# Patient Record
Sex: Female | Born: 1980 | Race: Black or African American | Hispanic: No | Marital: Single | Smoking: Never smoker
Health system: Southern US, Community
[De-identification: ages and names within clinical notes are randomized; demographics above are authoritative.]

## PROBLEM LIST (undated history)

## (undated) DIAGNOSIS — D369 Benign neoplasm, unspecified site: Secondary | ICD-10-CM

## (undated) DIAGNOSIS — B977 Papillomavirus as the cause of diseases classified elsewhere: Secondary | ICD-10-CM

## (undated) DIAGNOSIS — T7840XA Allergy, unspecified, initial encounter: Secondary | ICD-10-CM

## (undated) DIAGNOSIS — B019 Varicella without complication: Secondary | ICD-10-CM

## (undated) DIAGNOSIS — J45909 Unspecified asthma, uncomplicated: Secondary | ICD-10-CM

## (undated) HISTORY — DX: Papillomavirus as the cause of diseases classified elsewhere: B97.7

## (undated) HISTORY — DX: Allergy, unspecified, initial encounter: T78.40XA

## (undated) HISTORY — DX: Unspecified asthma, uncomplicated: J45.909

## (undated) HISTORY — DX: Benign neoplasm, unspecified site: D36.9

## (undated) HISTORY — DX: Varicella without complication: B01.9

## (undated) HISTORY — PX: DERMOID CYST  EXCISION: SHX1452

## (undated) HISTORY — PX: APPENDECTOMY: SHX54

## (undated) HISTORY — PX: OTHER SURGICAL HISTORY: SHX169

---

## 2004-06-06 ENCOUNTER — Emergency Department (HOSPITAL_COMMUNITY): Admission: EM | Admit: 2004-06-06 | Discharge: 2004-06-06 | Payer: Self-pay | Admitting: Emergency Medicine

## 2004-07-10 ENCOUNTER — Emergency Department (HOSPITAL_COMMUNITY): Admission: EM | Admit: 2004-07-10 | Discharge: 2004-07-10 | Payer: Self-pay | Admitting: Family Medicine

## 2005-07-28 ENCOUNTER — Ambulatory Visit: Payer: Self-pay | Admitting: Obstetrics and Gynecology

## 2005-08-11 ENCOUNTER — Ambulatory Visit: Payer: Self-pay | Admitting: Obstetrics and Gynecology

## 2005-10-12 HISTORY — PX: WISDOM TOOTH EXTRACTION: SHX21

## 2005-10-15 ENCOUNTER — Emergency Department (HOSPITAL_COMMUNITY): Admission: EM | Admit: 2005-10-15 | Discharge: 2005-10-15 | Payer: Self-pay | Admitting: Emergency Medicine

## 2005-11-24 ENCOUNTER — Encounter (INDEPENDENT_AMBULATORY_CARE_PROVIDER_SITE_OTHER): Payer: Self-pay | Admitting: *Deleted

## 2005-11-24 ENCOUNTER — Ambulatory Visit: Payer: Self-pay | Admitting: Obstetrics and Gynecology

## 2006-02-20 ENCOUNTER — Emergency Department (HOSPITAL_COMMUNITY): Admission: EM | Admit: 2006-02-20 | Discharge: 2006-02-20 | Payer: Self-pay | Admitting: Family Medicine

## 2006-06-03 ENCOUNTER — Emergency Department (HOSPITAL_COMMUNITY): Admission: EM | Admit: 2006-06-03 | Discharge: 2006-06-03 | Payer: Self-pay | Admitting: Family Medicine

## 2006-06-24 ENCOUNTER — Ambulatory Visit: Payer: Self-pay | Admitting: *Deleted

## 2006-06-24 ENCOUNTER — Encounter (INDEPENDENT_AMBULATORY_CARE_PROVIDER_SITE_OTHER): Payer: Self-pay | Admitting: *Deleted

## 2006-09-23 ENCOUNTER — Ambulatory Visit: Payer: Self-pay | Admitting: *Deleted

## 2006-09-23 ENCOUNTER — Other Ambulatory Visit: Admission: RE | Admit: 2006-09-23 | Discharge: 2006-09-23 | Payer: Self-pay | Admitting: Obstetrics and Gynecology

## 2006-10-22 ENCOUNTER — Encounter (INDEPENDENT_AMBULATORY_CARE_PROVIDER_SITE_OTHER): Payer: Self-pay | Admitting: *Deleted

## 2006-10-22 ENCOUNTER — Ambulatory Visit: Payer: Self-pay | Admitting: Family Medicine

## 2006-10-22 ENCOUNTER — Other Ambulatory Visit: Admission: RE | Admit: 2006-10-22 | Discharge: 2006-10-22 | Payer: Self-pay | Admitting: Obstetrics and Gynecology

## 2006-11-05 ENCOUNTER — Ambulatory Visit: Payer: Self-pay | Admitting: Obstetrics & Gynecology

## 2007-04-14 ENCOUNTER — Emergency Department (HOSPITAL_COMMUNITY): Admission: EM | Admit: 2007-04-14 | Discharge: 2007-04-14 | Payer: Self-pay | Admitting: Family Medicine

## 2007-04-29 ENCOUNTER — Encounter: Payer: Self-pay | Admitting: Obstetrics & Gynecology

## 2007-04-29 ENCOUNTER — Ambulatory Visit: Payer: Self-pay | Admitting: Obstetrics & Gynecology

## 2007-10-21 ENCOUNTER — Ambulatory Visit: Payer: Self-pay | Admitting: Obstetrics & Gynecology

## 2007-10-21 ENCOUNTER — Encounter: Payer: Self-pay | Admitting: Obstetrics & Gynecology

## 2008-01-26 ENCOUNTER — Emergency Department (HOSPITAL_COMMUNITY): Admission: EM | Admit: 2008-01-26 | Discharge: 2008-01-26 | Payer: Self-pay | Admitting: Emergency Medicine

## 2008-03-21 ENCOUNTER — Ambulatory Visit: Payer: Self-pay | Admitting: Obstetrics and Gynecology

## 2008-03-21 ENCOUNTER — Encounter: Payer: Self-pay | Admitting: Obstetrics and Gynecology

## 2009-04-10 ENCOUNTER — Encounter: Payer: Self-pay | Admitting: Advanced Practice Midwife

## 2009-04-10 ENCOUNTER — Ambulatory Visit: Payer: Self-pay | Admitting: Obstetrics & Gynecology

## 2009-06-22 ENCOUNTER — Emergency Department (HOSPITAL_COMMUNITY): Admission: EM | Admit: 2009-06-22 | Discharge: 2009-06-22 | Payer: Self-pay | Admitting: Emergency Medicine

## 2010-04-11 ENCOUNTER — Encounter: Payer: Self-pay | Admitting: Physician Assistant

## 2010-04-11 ENCOUNTER — Ambulatory Visit: Payer: Self-pay | Admitting: Obstetrics & Gynecology

## 2010-04-11 LAB — CONVERTED CEMR LAB: Pap Smear: NEGATIVE

## 2011-01-16 LAB — POCT URINALYSIS DIP (DEVICE)
Bilirubin Urine: NEGATIVE
Glucose, UA: NEGATIVE mg/dL
Hgb urine dipstick: NEGATIVE
Ketones, ur: NEGATIVE mg/dL
Nitrite: NEGATIVE
Protein, ur: NEGATIVE mg/dL
Specific Gravity, Urine: 1.015 (ref 1.005–1.030)
Urobilinogen, UA: 0.2 mg/dL (ref 0.0–1.0)
pH: 7.5 (ref 5.0–8.0)

## 2011-01-16 LAB — WET PREP, GENITAL
Trich, Wet Prep: NONE SEEN
WBC, Wet Prep HPF POC: NONE SEEN
Yeast Wet Prep HPF POC: NONE SEEN

## 2011-01-16 LAB — GC/CHLAMYDIA PROBE AMP, GENITAL
Chlamydia, DNA Probe: NEGATIVE
GC Probe Amp, Genital: NEGATIVE

## 2011-01-16 LAB — POCT PREGNANCY, URINE: Preg Test, Ur: NEGATIVE

## 2011-02-24 NOTE — Group Therapy Note (Signed)
NAME:  Kristen Floyd, Kristen Floyd NO.:  [id number]   MEDICAL RECORD NO.:  [id number]          PATIENT TYPE:  WOC   LOCATION:  [facility]                   FACILITY:  [facility]   PHYSICIAN:  Elsie Lincoln, MD      DATE OF BIRTH:  1980-10-18   DATE OF SERVICE:                                  CLINIC NOTE   HISTORY:  This is a 30 year old gravida 0, para 0, who presents today  for annual exam and Pap smear.  She has no complaints today.  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  She has had 2 Pap smears  in January 2009 and June 2009 that were both normal and so she has now  moved to the annual Pap smear schedule.  She denies any fever, abdominal  pain or other constitutional symptoms.   SOCIAL HISTORY:  The patient does not smoke.  She works as an Freight forwarder at [employer].   MEDICAL HISTORY:  Remarkable only for abnormal Paps.   SURGICAL HISTORY:  1. Remarkable for a right dermoid cyst removed in 1997 along with her      whole ovary on that side.  2. She also has a history of cryosurgery in 2006.   FAMILY HISTORY:  Noncontributory.   MEDICATIONS:  Tri-Sprintec 1 p.o. daily for contraception.   PHYSICAL EXAMINATION:  VITAL SIGNS:  Stable.  Blood pressure 120/84,  temperature 99, pulse 87, respiratory 16, weight 167.2 which is up from  December 2007 when it was 156.6, height is 5 feet 6-1/2 inches.  HEENT:  Within normal limits.  Thyroid normal, not enlarged, no nodules  appreciated.  CHEST:  Clear to auscultation.  HEART:  Regular rate and rhythm.  BREASTS:  Soft, nontender with no masses.  The patient perform her own  self breast exams at home.  ABDOMEN:  Soft, nontender.  Spleen and liver both normal.  No masses or  pain noted.  PELVIC:  Shows normal rugae in the vagina, small amount of white creamy  discharge is noted.  Cervix is nulliparous.  Pap smear was obtained as  was  gonorrhea and chlamydia per patient's request.  Bimanual exam shows  uterus that is small with no masses and nontender.  Right adnexa and  right ovary are surgically absent.  Left adnexa is normal.  There is  good vaginal tone.  No evidence of cystocele or rectocele.  EXTREMITIES:  Within normal limits.   ASSESSMENT:  29. A 31 year old nulliparous who presents for annual exam.  2. History of abnormal Pap.  3. History of dermoid cyst on the right.   PLAN:  1. Pap was obtained with gonorrhea and chlamydia cultures added.  2. Prescription for Tri-Sprintec 1 p.o. daily, number 3 months with      refills for 1 year.  3. Further followup for Paps will include annual Paps unless this one      is abnormal.     ______________________________  Wynelle Bourgeois, CNM    ______________________________  Elsie Lincoln, MD  MW/MEDQ  D:  04/10/2009  T:  04/11/2009  Job:  [id number]

## 2011-02-27 NOTE — Group Therapy Note (Signed)
NAME:  Kristen Floyd, GIESELMAN NO.:  [id number]   MEDICAL RECORD NO.:  [id number]          PATIENT TYPE:  WOC   [facility]ATION[facility]                   [facility]ACILITY[facility]  [facility]   PHYSICIAN:  Ellis Parents, MD    DATE OF BIRTH:  [birth year]   DATE OF SERVICE:  07/28/2005                                    CLINIC NOTE   This 30 year old multiparous female is referred by [facility] for  evaluation for a LEEP procedure.  Her history is that she had LSIL by  cytology on December 2004 and December 2005 and then on May 6, she had HSIL,  and June 2006, she had a colposcopy with biopsy showing CIN-1.  The patient  denies any history of D&C, therapeutic abortion, or any operative procedures  of the cervix.   PHYSICAL EXAMINATION:  The vagina is clean.  The cervix is very hypertrophic  with a very wide __________, probably in the range of 2-2.5 cm, and a large  external os.  It is deemed appropriate in this situation to return the  patient for cryosurgery.  The patient was seen in consultation with Dr.  Okey Dupre.  Everything was adequately explained to the patient.  Breast exam  revealed a well defined, 1 cm, firm, mobile, nontender mass in the left  breast, left upper outer quadrant.  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           ______________________________  Ellis Parents, MD     SA/MEDQ  D:  07/28/2005  T:  07/28/2005  Job:  [id number]

## 2011-02-27 NOTE — Group Therapy Note (Signed)
NAME:  Kristen Floyd, QUIZON NO.:  [id number]   MEDICAL RECORD NO.:  [id number]          PATIENT TYPE:  WOC   [facility]                   [facility]ILITY[facility]  [facility]   PHYSICIAN:  Carolanne Grumbling, M.D.   DATE OF BIRTH:  Nov 16, 1980   DATE OF SERVICE:  06/24/2006                                    CLINIC NOTE   A 30 year old, G0 here for repeat Pap smear. The patient had low cell by  cytology in December 2004 and December 2005. On Feb 14, 2005, she had  __________  and in June 2006, she had colposcopy with biopsy showing CIN 1.  She then had cryotherapy August 11, 2005. 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 The patient is here for her  followup Pap smear. She denies any problems.   The patient also requests birth control pills. [facility] and would like [facility]. She denies any history of  hypertension or blood clots. She also does not smoke.   PHYSICAL EXAMINATION:  VITAL SIGNS:  Per nursing.  GENERAL:  A well-developed, well-nourished female in no apparent distress.  GU:  External genitalia within normal limits. __________  cervix and without  lesions. Uterus anteverted, within normal limits. Adnexa is free of masses.   ASSESSMENT/PLAN:  1. 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 Repeat Pap smear done today, the patient will be      notified via mail with the next __________ .  2. Contraceptive counseling. Prescription for Lo/Ovral was given today.           ______________________________  Carolanne Grumbling, M.D.     TW/MEDQ  D:  06/24/2006  T:  06/25/2006  Job:  [id number]

## 2011-05-04 ENCOUNTER — Ambulatory Visit (INDEPENDENT_AMBULATORY_CARE_PROVIDER_SITE_OTHER): Payer: Commercial Managed Care - PPO | Admitting: Obstetrics and Gynecology

## 2011-05-04 ENCOUNTER — Other Ambulatory Visit (HOSPITAL_COMMUNITY)
Admission: RE | Admit: 2011-05-04 | Discharge: 2011-05-04 | Disposition: A | Discharge status: Home / Self Care | Payer: 59 | Source: Ambulatory Visit | Attending: Obstetrics and Gynecology | Admitting: Obstetrics and Gynecology

## 2011-05-04 ENCOUNTER — Encounter: Payer: Self-pay | Admitting: *Deleted

## 2011-05-04 VITALS — BP 135/92 | HR 74 | Temp 99.4°F | Ht 67.0 in | Wt 162.7 lb

## 2011-05-04 DIAGNOSIS — Z01419 Encounter for gynecological examination (general) (routine) without abnormal findings: Secondary | ICD-10-CM | POA: Insufficient documentation

## 2011-05-04 DIAGNOSIS — Z0000 Encounter for general adult medical examination without abnormal findings: Secondary | ICD-10-CM

## 2011-05-04 NOTE — Progress Notes (Signed)
Patient is a 30 year old nulligravida African American female. She is in for her annual GYN exam and has had a history of L. SIL which was treated with cryosurgery. Scratch that her last Pap smear one year ago was normal. Breast examination breasts are symmetrical no dominant masses no nipple discharge no supraclavicular or axillary nodes do an examination was soft flat nontender no masses or organomegaly external genitalia is normal uterus within normal limits vagina is clean well rugated cervix clean nulliparous Pap smear was taken uterus anterior normal size shape consistency adnexa could not be outlined to the [place] the patient. The patient has only one ovary had an oophorectomy at age of 69 her dermoid cyst torsion.

## 2011-07-01 ENCOUNTER — Inpatient Hospital Stay (INDEPENDENT_AMBULATORY_CARE_PROVIDER_SITE_OTHER)
Admission: RE | Admit: 2011-07-01 | Discharge: 2011-07-01 | Disposition: A | Discharge status: Home / Self Care | Payer: 59 | Source: Ambulatory Visit | Attending: Emergency Medicine | Admitting: Emergency Medicine

## 2011-07-01 ENCOUNTER — Ambulatory Visit (INDEPENDENT_AMBULATORY_CARE_PROVIDER_SITE_OTHER): Payer: 59

## 2011-07-01 DIAGNOSIS — J45909 Unspecified asthma, uncomplicated: Secondary | ICD-10-CM

## 2011-07-01 DIAGNOSIS — R197 Diarrhea, unspecified: Secondary | ICD-10-CM

## 2011-07-01 LAB — POCT I-STAT, CHEM 8
BUN: 4 mg/dL — ABNORMAL LOW (ref 6–23)
Calcium, Ion: 1.15 mmol/L (ref 1.12–1.32)
Chloride: 104 mEq/L (ref 96–112)
Creatinine, Ser: 0.9 mg/dL (ref 0.50–1.10)
Glucose, Bld: 95 mg/dL (ref 70–99)
HCT: 41 % (ref 36.0–46.0)
Hemoglobin: 13.9 g/dL (ref 12.0–15.0)
Potassium: 3.4 mEq/L — ABNORMAL LOW (ref 3.5–5.1)
Sodium: 142 mEq/L (ref 135–145)
TCO2: 24 mmol/L (ref 0–100)

## 2011-07-28 LAB — POCT RAPID STREP A: Streptococcus, Group A Screen (Direct): NEGATIVE

## 2012-04-15 ENCOUNTER — Other Ambulatory Visit: Payer: Self-pay | Admitting: Obstetrics & Gynecology

## 2012-05-04 ENCOUNTER — Ambulatory Visit: Payer: 59 | Admitting: Physician Assistant

## 2012-05-11 ENCOUNTER — Encounter: Payer: Self-pay | Admitting: Family

## 2012-05-11 ENCOUNTER — Ambulatory Visit (INDEPENDENT_AMBULATORY_CARE_PROVIDER_SITE_OTHER): Payer: 59 | Admitting: Family

## 2012-05-11 VITALS — BP 128/91 | HR 80 | Temp 97.5°F | Resp 20 | Ht 66.5 in | Wt 147.7 lb

## 2012-05-11 DIAGNOSIS — Z01419 Encounter for gynecological examination (general) (routine) without abnormal findings: Secondary | ICD-10-CM

## 2012-05-11 NOTE — Progress Notes (Signed)
  Subjective:     Kristen Floyd is a 31 y.o. female here for a routine exam.  Current complaints: None today; desires pregnancy in approximately one year.  Personal health questionnaire reviewed: yes.  Desires screen for sickle cell, mother has sickle cell disease.     Gynecologic History Patient's last menstrual period was 05/04/2012. Contraception: OCP (estrogen/progesterone) Last Pap: 05/03/12. Results were: normal Last mammogram: n/a.   Obstetric History OB History    Grav Para Term Preterm Abortions TAB SAB Ect Mult Living   0 0               The following portions of the patient's history were reviewed and updated as appropriate: allergies, current medications, past family history, past medical history, past social history, past surgical history and problem list.  Review of Systems Pertinent items are noted in HPI.    Objective:    BP 128/91  Pulse 80  Temp 97.5 F (36.4 C) (Oral)  Resp 20  Ht 5' 6.5" (1.689 m)  Wt 147 lb 11.2 oz (66.996 kg)  BMI 23.48 kg/m2  LMP 05/04/2012 GENERAL: Well-developed, well-nourished female in no acute distress.  HEENT: Normocephalic, atraumatic. Sclerae anicteric.  NECK: Supple. Normal thyroid.  LUNGS: Clear to auscultation bilaterally.  HEART: Regular rate and rhythm. BREASTS: Symmetric in size. No masses, skin changes, nipple drainage, or lymphadenopathy. ABDOMEN: Soft, nontender, nondistended. No organomegaly. PELVIC: Normal external female genitalia. Vagina is pink and rugated.  Normal discharge. Normal cervix contour. Pap smear obtained. Uterus is normal in size. No adnexal mass or tenderness.  EXTREMITIES: No cyanosis, clubbing, or edema, 2+ distal pulses.   Assessment:    Healthy female exam.    Plan:   Follow up pap smear Routine preventative health maintenance measures emphasized

## 2012-05-12 LAB — SICKLE CELL SCREEN: Sickle Cell Screen: NEGATIVE

## 2012-05-20 ENCOUNTER — Telehealth: Payer: Self-pay | Admitting: *Deleted

## 2012-05-20 NOTE — Telephone Encounter (Signed)
Pt called requesting her pap smear and sickle cell results. Asked that we return her call on her cell phone [phone]

## 2012-05-23 NOTE — Telephone Encounter (Signed)
Called pt and discussed her test results for sickle cell and Pap.  Pt had additional questions regarding that her sickle cell test was negative, her mother has the disease and father is negative.  She is wondering why she would not test positive for the trait. I told pt that I did not know why and recommended that she discuss it with her PCP.  Pt states that she does not currently have a PCP but is working on that. We also discussed the results of her Pap and that she will need a colpo because of the abnormal results. Pt has had abnormal Pap and colpo in the past- approx. 6 yrs ago.  She stated understanding of the plan of care. I told her she will be contacted with an appt.  Pt voiced understanding.

## 2012-06-23 ENCOUNTER — Encounter: Payer: 59 | Admitting: Obstetrics & Gynecology

## 2012-06-24 ENCOUNTER — Other Ambulatory Visit (HOSPITAL_COMMUNITY)
Admission: RE | Admit: 2012-06-24 | Discharge: 2012-06-24 | Disposition: A | Discharge status: Home / Self Care | Payer: 59 | Source: Ambulatory Visit | Attending: Obstetrics & Gynecology | Admitting: Obstetrics & Gynecology

## 2012-06-24 ENCOUNTER — Encounter: Payer: Self-pay | Admitting: Obstetrics & Gynecology

## 2012-06-24 ENCOUNTER — Ambulatory Visit (INDEPENDENT_AMBULATORY_CARE_PROVIDER_SITE_OTHER): Payer: 59 | Admitting: Obstetrics & Gynecology

## 2012-06-24 VITALS — BP 117/76 | HR 93 | Temp 97.1°F | Ht 66.0 in | Wt 142.0 lb

## 2012-06-24 DIAGNOSIS — R87612 Low grade squamous intraepithelial lesion on cytologic smear of cervix (LGSIL): Secondary | ICD-10-CM

## 2012-06-24 DIAGNOSIS — N870 Mild cervical dysplasia: Secondary | ICD-10-CM | POA: Insufficient documentation

## 2012-06-24 LAB — POCT PREGNANCY, URINE: Preg Test, Ur: NEGATIVE

## 2012-06-24 NOTE — Progress Notes (Signed)
  Subjective:    Patient ID: Kristen Floyd, female    DOB: [birth year], 31 y.o.   MRN: [id number]  HPI  She is here for a colposcopy because of a LGSIL pap. She had a LEEP in her 29s and says that her paps have been normal for at least the last 5 years. She has been monogamous for the last 7 months. Review of Systems     Objective:   Physical Exam  Colposcopy adequate. Cervix shows changes c/w a LEEP. No evidence of dysplasia. ECC done.      Assessment & Plan:  Recurrent LGSIL. Await ECC.

## 2012-07-18 ENCOUNTER — Ambulatory Visit (INDEPENDENT_AMBULATORY_CARE_PROVIDER_SITE_OTHER): Payer: 59 | Admitting: Family Medicine

## 2012-07-18 ENCOUNTER — Encounter: Payer: Self-pay | Admitting: Family Medicine

## 2012-07-18 VITALS — BP 112/80 | HR 97 | Temp 99.1°F | Ht 67.25 in | Wt 147.4 lb

## 2012-07-18 DIAGNOSIS — J302 Other seasonal allergic rhinitis: Secondary | ICD-10-CM

## 2012-07-18 DIAGNOSIS — J309 Allergic rhinitis, unspecified: Secondary | ICD-10-CM | POA: Insufficient documentation

## 2012-07-18 MED ORDER — MOMETASONE FUROATE 50 MCG/ACT NA SUSP: 2.0000 | Freq: Every day | NASAL | Status: DC

## 2012-07-18 NOTE — Progress Notes (Signed)
  Subjective:    Patient ID: Kristen Floyd, female    DOB: Sep 25, 1981, 31 y.o.   MRN: [id number]  HPI New to establish.  Previous MD- [place].  Cough- sxs started 10 days ago, cough is dry, nonproductive.  Initially felt this was due to environmental allergies but in last 3-4 days has felt chest congestion, some tightness.  No facial pain/pressure.  No ear pain.  No nasal congestion.  + PND.  Using Flonase PRN- unable to use regularly b/c cannot tolerate the smell/taste.  Not currently on OTC antihistamine.  + sick contacts.  No fevers.  Able to sleep through the night, cough worse during the day.   Review of Systems For ROS see HPI     Objective:   Physical Exam  Vitals reviewed. Constitutional: She appears well-developed and well-nourished. No distress.  HENT:  Head: Normocephalic and atraumatic.  Right Ear: Tympanic membrane normal.  Left Ear: Tympanic membrane normal.  Nose: Mucosal edema and rhinorrhea present. Right sinus exhibits no maxillary sinus tenderness and no frontal sinus tenderness. Left sinus exhibits no maxillary sinus tenderness and no frontal sinus tenderness.  Mouth/Throat: Mucous membranes are normal. Posterior oropharyngeal erythema (w/ PND) present.  Eyes: Conjunctivae normal and EOM are normal. Pupils are equal, round, and reactive to light.  Neck: Normal range of motion. Neck supple.  Cardiovascular: Normal rate, regular rhythm and normal heart sounds.   Pulmonary/Chest: Effort normal and breath sounds normal. No respiratory distress. She has no wheezes. She has no rales.  Lymphadenopathy:    She has no cervical adenopathy.          Assessment & Plan:

## 2012-07-18 NOTE — Patient Instructions (Addendum)
This is all allergy related Start the Zyrtec daily Start the Nasonex- 2 sprays each nostril daily Drink plenty of fluids Call with any questions or concerns Welcome!  We're glad to have you!!!

## 2012-07-18 NOTE — Assessment & Plan Note (Signed)
New to provider, chronic for pt.  This is cause of pt's cough.  Start OTC antihistamine.  Switch from Flonase to Nasonex.  Sample and script given.  Reviewed supportive care and red flags that should prompt return.  Pt expressed understanding and is in agreement w/ plan.

## 2012-07-26 ENCOUNTER — Telehealth: Payer: Self-pay | Admitting: Medical

## 2012-07-26 NOTE — Telephone Encounter (Signed)
Message copied by Freddi Starr on Tue Jul 26, 2012 10:48 AM ------      Message from: Allie Bossier      Created: Mon Jul 25, 2012  3:54 PM       She needs a pap and co testing in Sept 2014.      Thanks

## 2012-07-26 NOTE — Telephone Encounter (Signed)
Called and informed patient that she will need next pap smear in sept. 2014. Told her that these appointment are not yet available for scheduling, so the patient should call back a month or two ahead of time to make that appointment. The patient voiced understanding and did not have any further questions.

## 2013-06-02 ENCOUNTER — Telehealth: Payer: Self-pay

## 2013-06-02 ENCOUNTER — Telehealth: Payer: Self-pay | Admitting: *Deleted

## 2013-06-02 MED ORDER — LEVONORGEST-ETH ESTRAD 91-DAY 0.15-0.03 MG PO TABS: 1.0000 | ORAL_TABLET | Freq: Every day | ORAL | Status: DC

## 2013-06-02 NOTE — Telephone Encounter (Signed)
Pt has upcoming appt w/ GYN as noted in system- needs to contact them for birth control change

## 2013-06-02 NOTE — Telephone Encounter (Signed)
Message left on triage voicemail: Patient would like BCP changed  I called patient to get more details: Patient would like BCP changed because cycle is running about 23 days LMP: 05/31/13, Last Pap: 04/2012, Pending Pap appointment: 07/17/2013  Patient is requesting to change to [facility] or [facility], Please advise (pharmacy updated )

## 2013-06-02 NOTE — Telephone Encounter (Signed)
Patient aware of recommendation and verbalized understanding

## 2013-06-02 NOTE — Telephone Encounter (Signed)
Pt left message requesting to change birth control to [place] or [facility]. She stated that a detailed message can be left on her voice mail as she is at work. I consulted with Dr. Macon Large and received order for [place].  I left message for pt that new Rx has been sent to her pharmacy. She should begin the new pills after her current pill pack is finished.

## 2013-07-17 ENCOUNTER — Encounter: Payer: Self-pay | Admitting: Obstetrics & Gynecology

## 2013-07-17 ENCOUNTER — Ambulatory Visit (INDEPENDENT_AMBULATORY_CARE_PROVIDER_SITE_OTHER): Payer: 59 | Admitting: Obstetrics & Gynecology

## 2013-07-17 VITALS — BP 114/73 | HR 85 | Temp 98.0°F | Ht 67.0 in | Wt 170.0 lb

## 2013-07-17 DIAGNOSIS — Z0000 Encounter for general adult medical examination without abnormal findings: Secondary | ICD-10-CM

## 2013-07-17 DIAGNOSIS — Z01419 Encounter for gynecological examination (general) (routine) without abnormal findings: Secondary | ICD-10-CM

## 2013-07-17 NOTE — Progress Notes (Signed)
Subjective:    Kristen Floyd is a 32 y.o. S AA female who presents for an annual exam. The patient has no complaints today. She uses OCPs and condoms.  The patient is sexually active. GYN screening history: last pap: was abnormal: LGSIL with a normal colpo 06/2012.. The patient wears seatbelts: yes. The patient participates in regular exercise: no. Has the patient ever been transfused or tattooed?: yes. The patient reports that there is not domestic violence in her life.   Menstrual History: OB History   Grav Para Term Preterm Abortions TAB SAB Ect Mult Living   0 0              Menarche age: 86 [place]che: 89  Patient's last menstrual period was 07/04/2013.    The following portions of the patient's history were reviewed and updated as appropriate: allergies, current medications, past family history, past medical history, past social history, past surgical history and problem list.  Review of Systems A comprehensive review of systems was negative. Monogamous for 2 months. She works at [employer].   Objective:    BP 114/73  Pulse 85  Temp(Src) 98 F (36.7 C) (Oral)  Ht 5\' 7"  (1.702 m)  Wt 170 lb (77.111 kg)  BMI 26.62 kg/m2  LMP 07/04/2013  General Appearance:    Alert, cooperative, no distress, appears stated age  Head:    Normocephalic, without obvious abnormality, atraumatic  Eyes:    PERRL, conjunctiva/corneas clear, EOM's intact, fundi    benign, both eyes  Ears:    Normal TM's and external ear canals, both ears  Nose:   Nares normal, septum midline, mucosa normal, no drainage    or sinus tenderness  Throat:   Lips, mucosa, and tongue normal; teeth and gums normal  Neck:   Supple, symmetrical, trachea midline, no adenopathy;    thyroid:  no enlargement/tenderness/nodules; no carotid   bruit or JVD  Back:     Symmetric, no curvature, ROM normal, no CVA tenderness  Lungs:     Clear to auscultation bilaterally, respirations unlabored  Chest Wall:    No  tenderness or deformity   Heart:    Regular rate and rhythm, S1 and S2 normal, no murmur, rub   or gallop  Breast Exam:    No tenderness, masses, or nipple abnormality  Abdomen:     Soft, non-tender, bowel sounds active all four quadrants,    no masses, no organomegaly  Genitalia:    Normal female without lesion, discharge or tenderness, NSSA, NT, mobile, normal adnexal exam     Extremities:   Extremities normal, atraumatic, no cyanosis or edema  Pulses:   2+ and symmetric all extremities  Skin:   Skin color, texture, turgor normal, no rashes or lesions  Lymph nodes:   Cervical, supraclavicular, and axillary nodes normal  Neurologic:   CNII-XII intact, normal strength, sensation and reflexes    throughout  .    Assessment:    Healthy female exam.    Plan:     Breast self exam technique reviewed and patient encouraged to perform self-exam monthly. Thin prep Pap smear. with cotesting STI testing per request

## 2013-07-17 NOTE — Patient Instructions (Signed)

## 2013-07-18 LAB — HEPATITIS C ANTIBODY: HCV Ab: NEGATIVE

## 2013-07-18 LAB — HEPATITIS B SURFACE ANTIGEN: Hepatitis B Surface Ag: NEGATIVE

## 2013-07-18 LAB — RPR

## 2013-07-18 LAB — HIV ANTIBODY (ROUTINE TESTING W REFLEX): HIV: NONREACTIVE

## 2013-07-26 ENCOUNTER — Telehealth: Payer: Self-pay | Admitting: Obstetrics and Gynecology

## 2013-07-26 NOTE — Telephone Encounter (Addendum)
Called patient; no answer. Left message to call us back and give result noted below from Dr. Marice Potter and Colpo appt needed.   07/26/13 @1555  --- Patient called back and gave pap result. She has made appt for colposcopy as advised on 08/28/13 @ 1400. Patient satisfied.      Message copied by Toula Moos on Wed Jul 26, 2013  3:16 PM ------      Message from: Allie Bossier      Created: Wed Jul 26, 2013  9:53 AM       Her pap is ASCUS+HR HPV. She will need a colpo scheduled.      Thanks ------

## 2013-08-01 ENCOUNTER — Encounter: Payer: Self-pay | Admitting: Obstetrics & Gynecology

## 2013-08-17 ENCOUNTER — Other Ambulatory Visit: Payer: Self-pay

## 2013-08-23 ENCOUNTER — Encounter: Payer: PRIVATE HEALTH INSURANCE | Admitting: Obstetrics & Gynecology

## 2013-08-23 ENCOUNTER — Ambulatory Visit: Payer: PRIVATE HEALTH INSURANCE | Admitting: Obstetrics & Gynecology

## 2013-08-28 ENCOUNTER — Encounter: Payer: PRIVATE HEALTH INSURANCE | Admitting: Obstetrics and Gynecology

## 2013-09-01 ENCOUNTER — Encounter: Payer: Self-pay | Admitting: Obstetrics & Gynecology

## 2013-09-18 ENCOUNTER — Ambulatory Visit (INDEPENDENT_AMBULATORY_CARE_PROVIDER_SITE_OTHER): Payer: PRIVATE HEALTH INSURANCE | Admitting: Obstetrics & Gynecology

## 2013-09-18 ENCOUNTER — Encounter: Payer: Self-pay | Admitting: Obstetrics & Gynecology

## 2013-09-18 ENCOUNTER — Other Ambulatory Visit (HOSPITAL_COMMUNITY)
Admission: RE | Admit: 2013-09-18 | Discharge: 2013-09-18 | Disposition: A | Discharge status: Home / Self Care | Payer: PRIVATE HEALTH INSURANCE | Source: Ambulatory Visit | Attending: Obstetrics & Gynecology | Admitting: Obstetrics & Gynecology

## 2013-09-18 VITALS — BP 120/75 | HR 95 | Ht 67.0 in | Wt 168.3 lb

## 2013-09-18 DIAGNOSIS — R87810 Cervical high risk human papillomavirus (HPV) DNA test positive: Secondary | ICD-10-CM

## 2013-09-18 DIAGNOSIS — N72 Inflammatory disease of cervix uteri: Secondary | ICD-10-CM | POA: Insufficient documentation

## 2013-09-18 DIAGNOSIS — R87610 Atypical squamous cells of undetermined significance on cytologic smear of cervix (ASC-US): Secondary | ICD-10-CM

## 2013-09-18 DIAGNOSIS — N870 Mild cervical dysplasia: Secondary | ICD-10-CM | POA: Insufficient documentation

## 2013-09-18 LAB — POCT PREGNANCY, URINE: Preg Test, Ur: NEGATIVE

## 2013-09-18 NOTE — Patient Instructions (Signed)
Colposcopy Care After Colposcopy is a procedure in which a special tool is used to magnify the surface of the cervix. A tissue sample (biopsy) may also be taken. This sample will be looked at for cervical cancer or other problems. After the test:  You may have some cramping.  Lie down for a few minutes if you feel lightheaded.   You may have some bleeding which should stop in a few days. HOME CARE  Do not have sex or use tampons for 2 to 3 days or as told.  Only take medicine as told by your doctor.  Continue to take your birth control pills as usual. Finding out the results of your test Ask when your test results will be ready. Make sure you get your test results. GET HELP RIGHT AWAY IF:  You are bleeding a lot or are passing blood clots.  You develop a fever of 102 F (38.9 C) or higher.  You have abnormal vaginal discharge.  You have cramps that do not go away with medicine.  You feel lightheaded, dizzy, or pass out (faint). MAKE SURE YOU:   Understand these instructions.  Will watch your condition.  Will get help right away if you are not doing well or get worse. Document Released: 03/16/2008 Document Revised: 12/21/2011 Document Reviewed: 04/27/2013 Exit[employer] Patient Information 2014 [employer].  

## 2013-09-18 NOTE — Progress Notes (Signed)
   Subjective:    Patient ID: Kristen Floyd, female    DOB: 1981-07-09, 32 y.o.   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 She had a LGSIL pap last year with a normal colpo but ECC showed "detatched fragment of LGSIL".   Review of Systems  She has had a flu vaccine     Objective:   Physical Exam  UPT negative, consent signed, time out done Cervix prepped with acetic acid. Transformation zone seen in its entirety. Colpo adequate. Changes c/w LGSIL seen as a triangle shape at the 12 o'clock position of the cervix (acetowhite changes) I did a cervical biopsy and used silver nitrate to yield hemostasis ECC obtained. She tolerated the procedure well.       Assessment & Plan:  ASCUS pap- await biopsy/ECC

## 2013-10-02 ENCOUNTER — Telehealth: Payer: Self-pay | Admitting: *Deleted

## 2013-10-02 ENCOUNTER — Encounter: Payer: Self-pay | Admitting: *Deleted

## 2013-10-02 NOTE — Telephone Encounter (Addendum)
Called pt and left message that I am responding to a request she previously made for a referral to Dr. Cordelia Poche in [place]. She had made the request in November via her My Chart account, then saw Dr. Marice Potter in our office on 09/18/13. We need to know if she still desires the referral and if so, for what purpose. Also, please provide any additional contact information for Dr. Bufford Buttner that she may have so that we may assist with her request. 12/23  1312  Pt left new message as a response to my call yesterday. She stated that she no longer works in the [employer] system and her co-pays will be less if she is seen within the [facility]. She further stated that she had discussed this with Dr. Marice Potter. She would like to move forward with the referral to Dr. Cordelia Poche for her Ob/Gyn care. She provided the telephone number of [phone] for Dr. Bufford Buttner.

## 2013-10-19 NOTE — Telephone Encounter (Signed)
Spoke to patient and advised that I have called the office of Dr. Wallis Mart ( OB/GYN clinic she wanted to move to since she now works for [employer]), they said that she does not need a formal referral note or request since she is not pregnant. She will make an appointment as a new patient there and also sign a HiPPa form at that clinic to request Pap results and notes from [place] if needed. Patient states understanding and satisfied.

## 2013-10-26 ENCOUNTER — Telehealth: Payer: Self-pay | Admitting: General Practice

## 2013-10-26 NOTE — Telephone Encounter (Signed)
Patient called and left message stating she would like a medical release form faxed to her job if possible so her new OB/GYN can receive her records from [place]. Called patient stating I was returning her phone call and that unfortunately due to confidentiality we couldn't fax the form to her work because there wouldn't be a way of knowing the form was actually filled out by her because someone could forge her signature, etc. So in order to maintain her privacy she would need to come by our office and fill out the form or fill out the form at her new ob/gyn office and they can fax the form to [place] requesting records. Patient verbalized understanding to all and stated that she could come by Monday because her office would be closed. Patient had no further questions

## 2014-12-19 ENCOUNTER — Encounter: Payer: Self-pay | Admitting: Family Medicine

## 2014-12-19 MED ORDER — ALBUTEROL SULFATE HFA 108 (90 BASE) MCG/ACT IN AERS
2.0000 | INHALATION_SPRAY | Freq: Four times a day (QID) | RESPIRATORY_TRACT | Status: DC | PRN
Start: 2014-12-19 — End: 2017-11-05

## 2015-09-02 ENCOUNTER — Encounter: Payer: PRIVATE HEALTH INSURANCE | Admitting: Family Medicine

## 2015-10-29 ENCOUNTER — Ambulatory Visit (HOSPITAL_BASED_OUTPATIENT_CLINIC_OR_DEPARTMENT_OTHER)
Admission: RE | Admit: 2015-10-29 | Discharge: 2015-10-29 | Disposition: A | Discharge status: Home / Self Care | Payer: PRIVATE HEALTH INSURANCE | Source: Ambulatory Visit | Attending: Family Medicine | Admitting: Family Medicine

## 2015-10-29 ENCOUNTER — Ambulatory Visit (INDEPENDENT_AMBULATORY_CARE_PROVIDER_SITE_OTHER): Payer: PRIVATE HEALTH INSURANCE | Admitting: Family Medicine

## 2015-10-29 ENCOUNTER — Encounter: Payer: Self-pay | Admitting: Family Medicine

## 2015-10-29 VITALS — BP 116/82 | HR 87 | Temp 98.6°F | Ht 67.0 in | Wt 168.4 lb

## 2015-10-29 DIAGNOSIS — E049 Nontoxic goiter, unspecified: Secondary | ICD-10-CM

## 2015-10-29 DIAGNOSIS — Z0000 Encounter for general adult medical examination without abnormal findings: Secondary | ICD-10-CM | POA: Diagnosis not present

## 2015-10-29 DIAGNOSIS — E010 Iodine-deficiency related diffuse (endemic) goiter: Secondary | ICD-10-CM

## 2015-10-29 DIAGNOSIS — Z0001 Encounter for general adult medical examination with abnormal findings: Secondary | ICD-10-CM

## 2015-10-29 LAB — CBC WITH DIFFERENTIAL/PLATELET
Basophils Absolute: 0 10*3/uL (ref 0.0–0.1)
Basophils Relative: 0.4 % (ref 0.0–3.0)
Eosinophils Absolute: 0.1 10*3/uL (ref 0.0–0.7)
Eosinophils Relative: 2.8 % (ref 0.0–5.0)
HCT: 44.1 % (ref 36.0–46.0)
Hemoglobin: 14.6 g/dL (ref 12.0–15.0)
Lymphocytes Relative: 35.9 % (ref 12.0–46.0)
Lymphs Abs: 1.6 10*3/uL (ref 0.7–4.0)
MCHC: 33 g/dL (ref 30.0–36.0)
MCV: 78.5 fl (ref 78.0–100.0)
Monocytes Absolute: 0.6 10*3/uL (ref 0.1–1.0)
Monocytes Relative: 13.2 % — ABNORMAL HIGH (ref 3.0–12.0)
Neutro Abs: 2.2 10*3/uL (ref 1.4–7.7)
Neutrophils Relative %: 47.7 % (ref 43.0–77.0)
Platelets: 376 10*3/uL (ref 150.0–400.0)
RBC: 5.63 Mil/uL — ABNORMAL HIGH (ref 3.87–5.11)
RDW: 13.5 % (ref 11.5–15.5)
WBC: 4.6 10*3/uL (ref 4.0–10.5)

## 2015-10-29 LAB — BASIC METABOLIC PANEL
BUN: 9 mg/dL (ref 6–23)
CO2: 30 mEq/L (ref 19–32)
Calcium: 9.2 mg/dL (ref 8.4–10.5)
Chloride: 104 mEq/L (ref 96–112)
Creatinine, Ser: 0.88 mg/dL (ref 0.40–1.20)
GFR: 94.88 mL/min (ref >60.00)
Glucose, Bld: 93 mg/dL (ref 70–99)
Potassium: 4 mEq/L (ref 3.5–5.1)
Sodium: 139 mEq/L (ref 135–145)

## 2015-10-29 LAB — TSH: TSH: 1.83 u[IU]/mL (ref 0.35–4.50)

## 2015-10-29 LAB — LIPID PANEL
Cholesterol: 170 mg/dL (ref 0–200)
HDL: 69.3 mg/dL (ref >39.00)
LDL Cholesterol: 85 mg/dL (ref 0–99)
NonHDL: 100.24
Total CHOL/HDL Ratio: 2
Triglycerides: 74 mg/dL (ref 0.0–149.0)
VLDL: 14.8 mg/dL (ref 0.0–40.0)

## 2015-10-29 LAB — HEPATIC FUNCTION PANEL
ALT: 10 U/L (ref 0–35)
AST: 11 U/L (ref 0–37)
Albumin: 4.1 g/dL (ref 3.5–5.2)
Alkaline Phosphatase: 57 U/L (ref 39–117)
Bilirubin, Direct: 0.2 mg/dL (ref 0.0–0.3)
Total Bilirubin: 0.9 mg/dL (ref 0.2–1.2)
Total Protein: 7.6 g/dL (ref 6.0–8.3)

## 2015-10-29 LAB — VITAMIN D 25 HYDROXY (VIT D DEFICIENCY, FRACTURES): VITD: 11.6 ng/mL — ABNORMAL LOW (ref 30.00–100.00)

## 2015-10-29 NOTE — Assessment & Plan Note (Signed)
Check labs.  Get [place] to assess.  Will follow.

## 2015-10-29 NOTE — Progress Notes (Signed)
   Subjective:    Patient ID: Kristen Floyd, female    DOB: 26-Oct-1980, 35 y.o.   MRN: [id number]  HPI CPE- UTD on pap (Dr Davina Poke).  No concerns today.  UTD on flu shot.  Exercising regularly   Review of Systems Patient reports no vision/ hearing changes, adenopathy,fever, weight change,  persistant/recurrent hoarseness , swallowing issues, chest pain, palpitations, edema, persistant/recurrent cough, hemoptysis, dyspnea (rest/exertional/paroxysmal nocturnal), gastrointestinal bleeding (melena, rectal bleeding), abdominal pain, significant heartburn, bowel changes, GU symptoms (dysuria, hematuria, incontinence), Gyn symptoms (abnormal  bleeding, pain),  syncope, focal weakness, memory loss, numbness & tingling, skin/hair/nail changes, abnormal bruising or bleeding, anxiety, or depression.     Objective:   Physical Exam General Appearance:    Alert, cooperative, no distress, appears stated age  Head:    Normocephalic, without obvious abnormality, atraumatic  Eyes:    PERRL, conjunctiva/corneas clear, EOM's intact, fundi    benign, both eyes  Ears:    Normal TM's and external ear canals, both ears  Nose:   Nares normal, septum midline, mucosa normal, no drainage    or sinus tenderness  Throat:   Lips, mucosa, and tongue normal; teeth and gums normal  Neck:   Supple, symmetrical, trachea midline, no adenopathy;    Thyroid: diffuse enlargement  Back:     Symmetric, no curvature, ROM normal, no CVA tenderness  Lungs:     Clear to auscultation bilaterally, respirations unlabored  Chest Wall:    No tenderness or deformity   Heart:    Regular rate and rhythm, S1 and S2 normal, no murmur, rub   or gallop  Breast Exam:    Deferred to GYN  Abdomen:     Soft, non-tender, bowel sounds active all four quadrants,    no masses, no organomegaly  Genitalia:    Deferred to GYN  Rectal:    Extremities:   Extremities normal, atraumatic, no cyanosis or edema  Pulses:   2+ and symmetric all extremities    Skin:   Skin color, texture, turgor normal, no rashes or lesions  Lymph nodes:   Cervical, supraclavicular, and axillary nodes normal  Neurologic:   CNII-XII intact, normal strength, sensation and reflexes    throughout          Assessment & Plan:

## 2015-10-29 NOTE — Progress Notes (Signed)
Pre visit review using our clinic review tool, if applicable. No additional management support is needed unless otherwise documented below in the visit note. 

## 2015-10-29 NOTE — Assessment & Plan Note (Signed)
Pt's PE WNL w/ exception of mild thyromegaly.  UTD on pap.  Check labs.  Anticipatory guidance provided.

## 2015-10-29 NOTE — Patient Instructions (Signed)
Follow in 1 year or as needed We'll notify you of your lab results and make any changes if needed Keep up the good work on healthy diet and regular exercise- you look great! We'll call you with your ultrasound appt Call with any questions or concerns If you want to join [place] at the new [place] office, any scheduled appointments will automatically transfer and we will see you at [address] [place] [address], [place], [place] [address] ([place]) Happy New Year!!!

## 2015-10-30 ENCOUNTER — Encounter: Payer: Self-pay | Admitting: Family Medicine

## 2015-10-30 MED ORDER — VITAMIN D (ERGOCALCIFEROL) 1.25 MG (50000 UNIT) PO CAPS: 50000.0000 [IU] | ORAL_CAPSULE | ORAL | Status: DC

## 2016-06-19 IMAGING — US US SOFT TISSUE HEAD/NECK
1 series · 13 of 25 positions shown · non-contrast
Comparison: None.

CLINICAL DATA: 34-year-old female with thyromegaly

EXAM:
THYROID ULTRASOUND
TECHNIQUE: Ultrasound examination of the thyroid gland and adjacent soft
tissues was performed.

[Series 1: us soft tissue head/neck · 0.05mm/px · 13 of 25 slices shown]
[im 1/25]
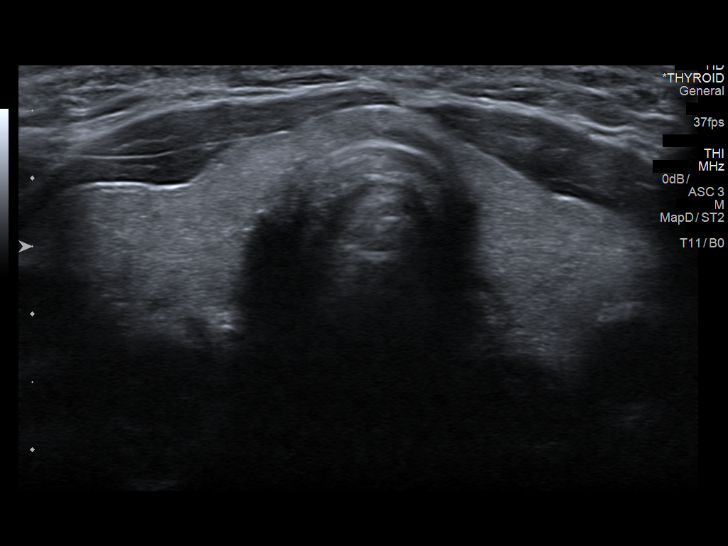
[im 3/25]
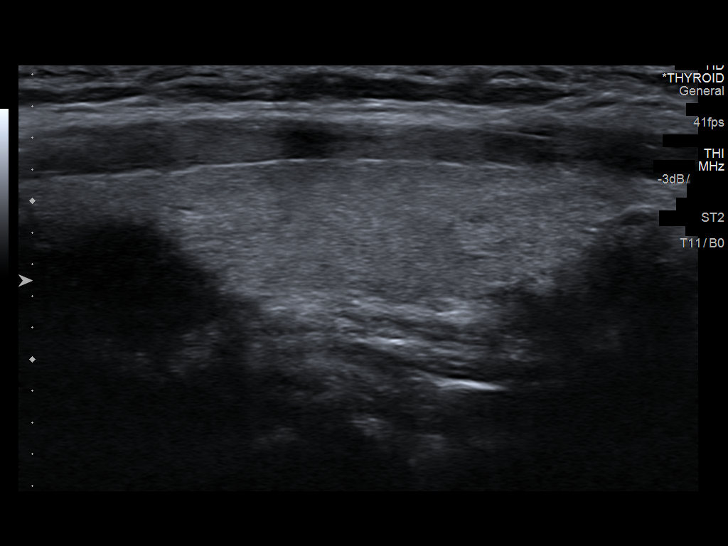
[im 5/25]
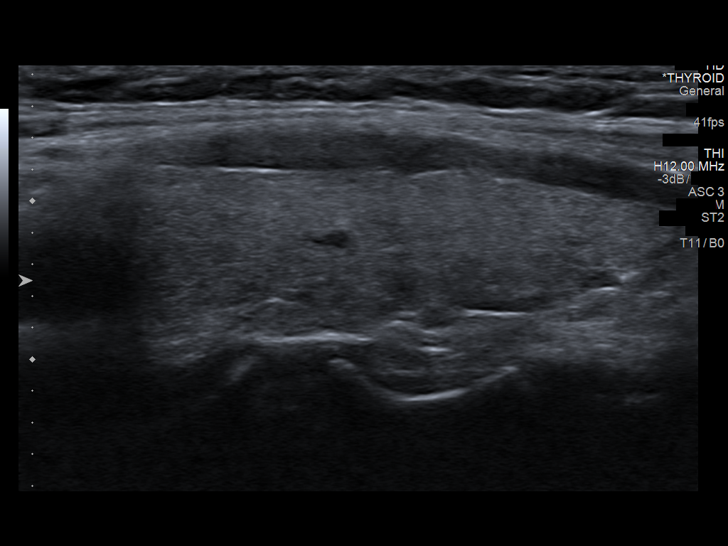
[im 7/25]
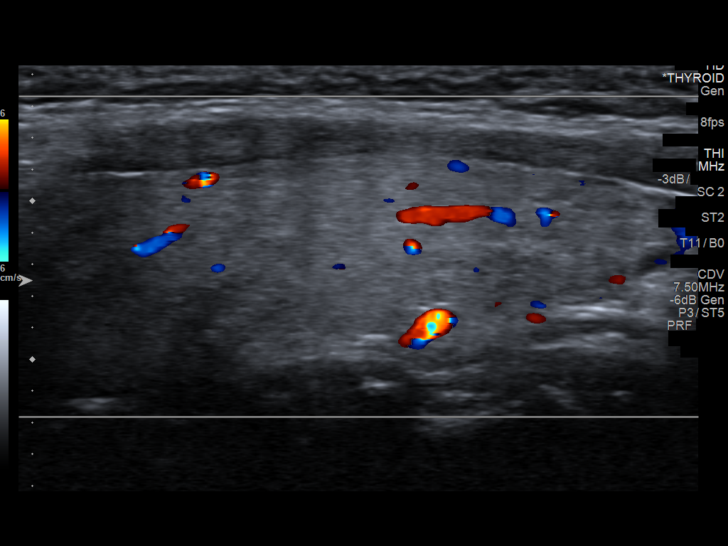
[im 9/25]
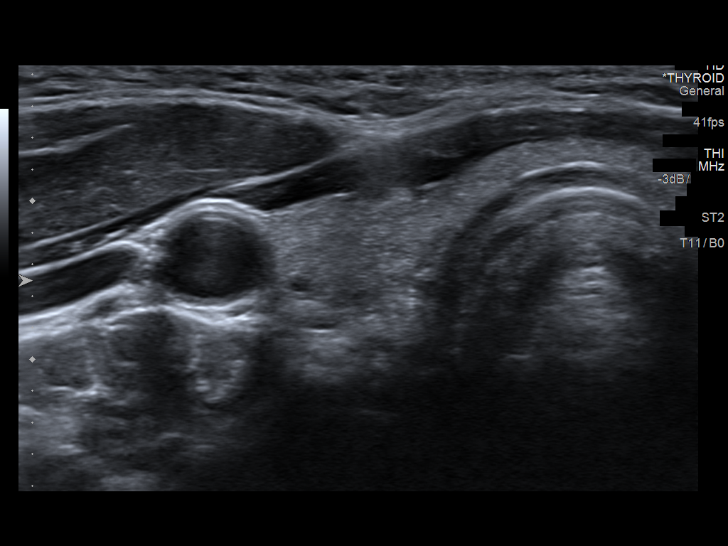
[im 11/25]
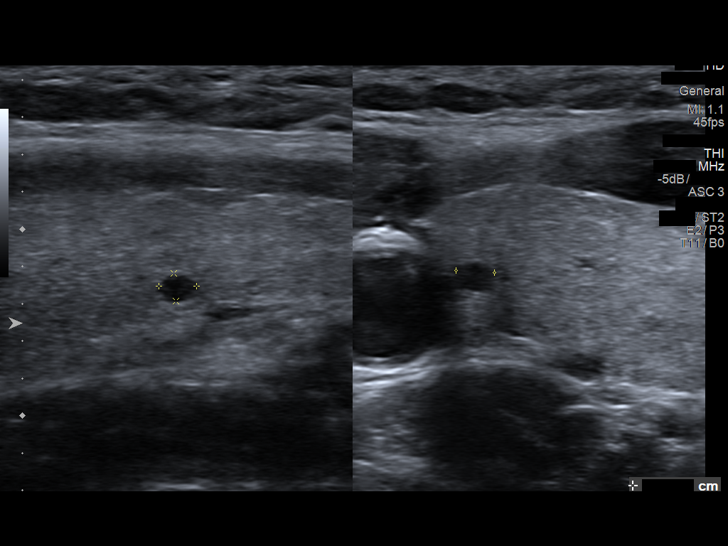
[im 13/25]
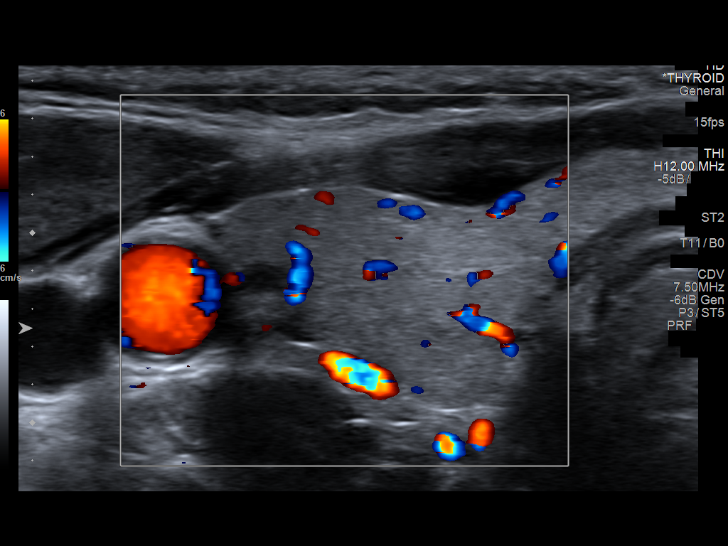
[im 15/25]
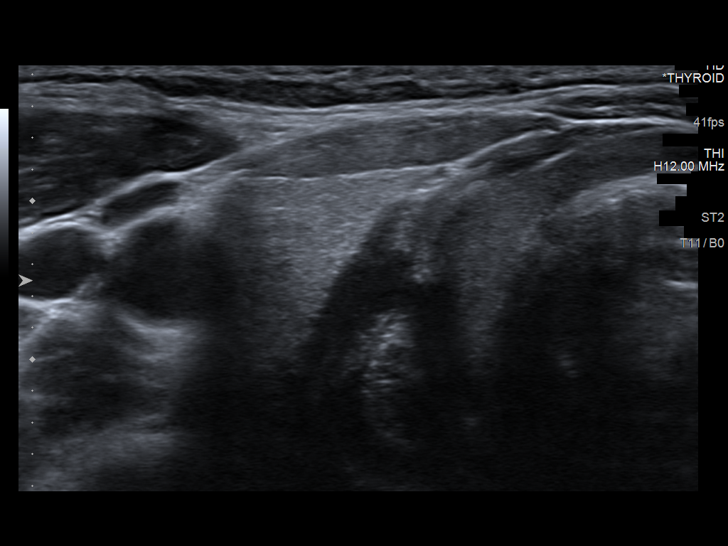
[im 17/25]
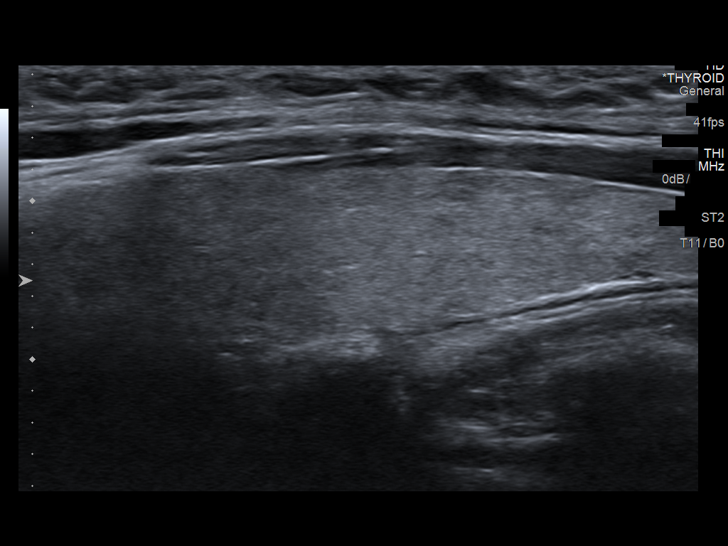
[im 19/25]
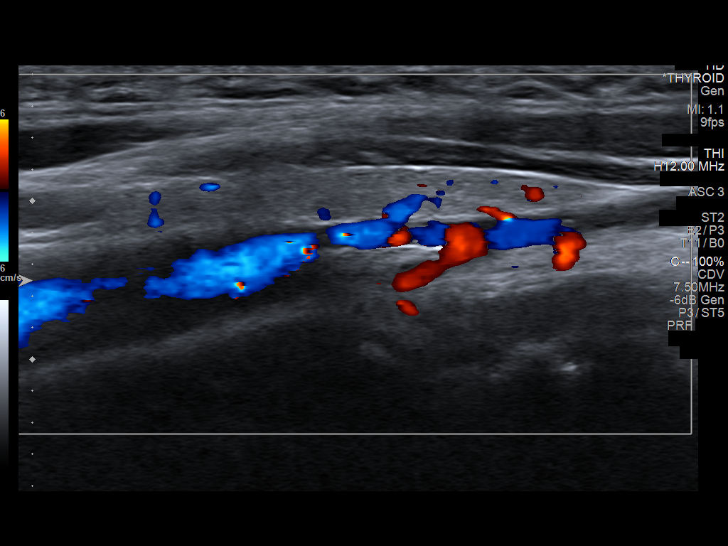
[im 21/25]
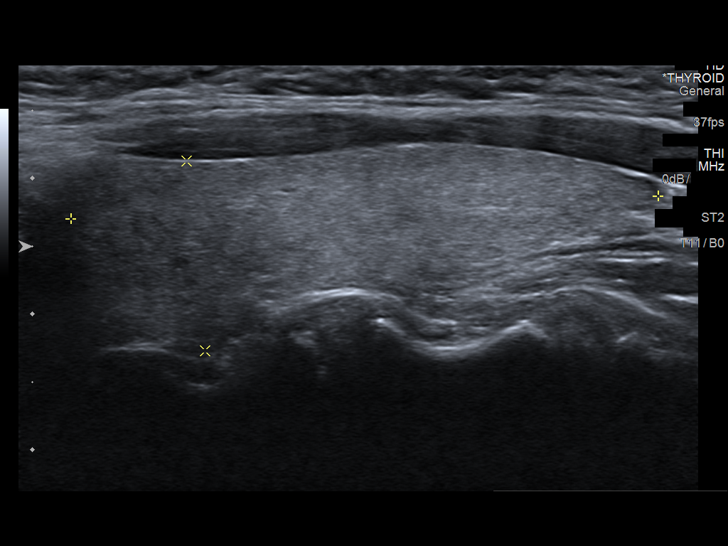
[im 23/25]
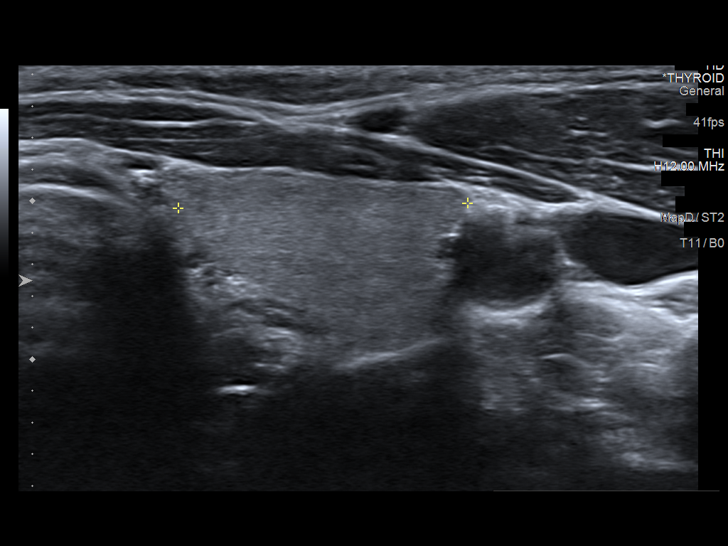
[im 25/25]
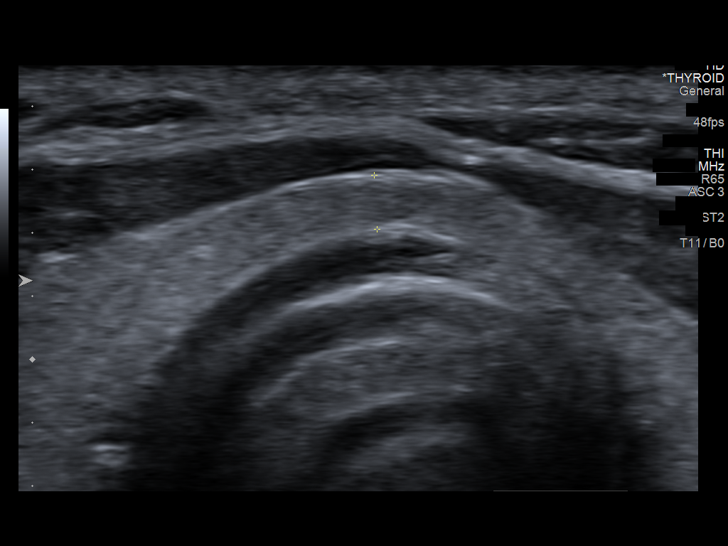

[13 of 25 positions shown; findings below may reference images not displayed]

FINDINGS: Right thyroid lobe

Measurements: 4.4 x 1.2 x 1.8 cm. Minimally heterogeneous thyroid
parenchyma. Solitary 2 mm hypoechoic cyst versus nodule in the mid
gland.

Left thyroid lobe

Measurements: 4.3 x 1.4 x 1.8 cm. Minimally heterogeneous thyroid
parenchyma. No nodules visualized.

Isthmus

Thickness: 0.2 cm.  No nodules visualized.

Lymphadenopathy

None visualized.
IMPRESSION: 1. Within normal limits for size.
2. The thyroid parenchyma is mildly heterogeneous.
3. Incidental note is made of a 2 mm hypoechoic cyst versus nodule
in the right mid gland. Findings do not meet current SRU consensus
criteria for biopsy. Follow-up by clinical exam is recommended. If
patient has known risk factors for thyroid carcinoma, consider
follow-up ultrasound in 12 months. If patient is clinically
hyperthyroid, consider nuclear medicine thyroid uptake and scan.

Reference: Management of Thyroid Nodules Detected at US: Society of
Radiologists in Ultrasound Consensus Conference Statement. Radiology

## 2016-08-20 LAB — HM PAP SMEAR

## 2016-08-24 ENCOUNTER — Encounter: Payer: Self-pay | Admitting: General Practice

## 2016-10-29 ENCOUNTER — Encounter: Payer: PRIVATE HEALTH INSURANCE | Admitting: Family Medicine

## 2016-11-04 ENCOUNTER — Encounter: Payer: Self-pay | Admitting: Family Medicine

## 2016-11-04 ENCOUNTER — Ambulatory Visit (INDEPENDENT_AMBULATORY_CARE_PROVIDER_SITE_OTHER): Payer: 59 | Admitting: Family Medicine

## 2016-11-04 VITALS — BP 117/82 | HR 92 | Temp 98.2°F | Resp 16 | Ht 67.0 in | Wt 173.0 lb

## 2016-11-04 DIAGNOSIS — Z0000 Encounter for general adult medical examination without abnormal findings: Secondary | ICD-10-CM

## 2016-11-04 DIAGNOSIS — E010 Iodine-deficiency related diffuse (endemic) goiter: Secondary | ICD-10-CM

## 2016-11-04 DIAGNOSIS — Z23 Encounter for immunization: Secondary | ICD-10-CM

## 2016-11-04 LAB — CBC WITH DIFFERENTIAL/PLATELET
Basophils Absolute: 0 cells/uL (ref 0–200)
Basophils Relative: 0 %
Eosinophils Absolute: 195 cells/uL (ref 15–500)
Eosinophils Relative: 3 %
HCT: 41.3 % (ref 35.0–45.0)
Hemoglobin: 13.5 g/dL (ref 11.7–15.5)
Lymphocytes Relative: 45 %
Lymphs Abs: 2925 cells/uL (ref 850–3900)
MCH: 26.1 pg — ABNORMAL LOW (ref 27.0–33.0)
MCHC: 32.7 g/dL (ref 32.0–36.0)
MCV: 79.7 fL — ABNORMAL LOW (ref 80.0–100.0)
MPV: 9 fL (ref 7.5–12.5)
Monocytes Absolute: 845 cells/uL (ref 200–950)
Monocytes Relative: 13 %
Neutro Abs: 2535 cells/uL (ref 1500–7800)
Neutrophils Relative %: 39 %
Platelets: 350 10*3/uL (ref 140–400)
RBC: 5.18 MIL/uL — ABNORMAL HIGH (ref 3.80–5.10)
RDW: 13.4 % (ref 11.0–15.0)
WBC: 6.5 10*3/uL (ref 3.8–10.8)

## 2016-11-04 LAB — BASIC METABOLIC PANEL
BUN: 10 mg/dL (ref 7–25)
CO2: 25 mmol/L (ref 20–31)
Calcium: 9.6 mg/dL (ref 8.6–10.2)
Chloride: 108 mmol/L (ref 98–110)
Creat: 0.89 mg/dL (ref 0.50–1.10)
Glucose, Bld: 86 mg/dL (ref 65–99)
Potassium: 3.8 mmol/L (ref 3.5–5.3)
Sodium: 140 mmol/L (ref 135–146)

## 2016-11-04 LAB — HEPATIC FUNCTION PANEL
ALT: 12 U/L (ref 6–29)
AST: 13 U/L (ref 10–30)
Albumin: 4.1 g/dL (ref 3.6–5.1)
Alkaline Phosphatase: 55 U/L (ref 33–115)
Bilirubin, Direct: 0.1 mg/dL (ref <=0.2)
Indirect Bilirubin: 0.5 mg/dL (ref 0.2–1.2)
Total Bilirubin: 0.6 mg/dL (ref 0.2–1.2)
Total Protein: 7.2 g/dL (ref 6.1–8.1)

## 2016-11-04 LAB — LIPID PANEL
Cholesterol: 164 mg/dL (ref <200)
HDL: 66 mg/dL (ref >50)
LDL Cholesterol: 82 mg/dL (ref <100)
Total CHOL/HDL Ratio: 2.5 Ratio (ref <5.0)
Triglycerides: 79 mg/dL (ref <150)
VLDL: 16 mg/dL (ref <30)

## 2016-11-04 LAB — TSH: TSH: 1.13 mIU/L

## 2016-11-04 NOTE — Assessment & Plan Note (Signed)
Pt's PE WNL w/ exception of thyromegaly.  Tdap updated.  Check labs.  Anticipatory guidance provided.

## 2016-11-04 NOTE — Progress Notes (Signed)
   Subjective:    Patient ID: Kristen Floyd, female    DOB: Oct 18, 1980, 36 y.o.   MRN: [id number]  HPI CPE- UTD on GYN.  Due for Tdap.   Review of Systems Patient reports no vision/ hearing changes, adenopathy,fever, weight change,  persistant/recurrent hoarseness , swallowing issues, chest pain, palpitations, edema, persistant/recurrent cough, hemoptysis, dyspnea (rest/exertional/paroxysmal nocturnal), gastrointestinal bleeding (melena, rectal bleeding), abdominal pain, significant heartburn, bowel changes, GU symptoms (dysuria, hematuria, incontinence), Gyn symptoms (abnormal  bleeding, pain),  syncope, focal weakness, memory loss, numbness & tingling, skin/hair/nail changes, abnormal bruising or bleeding, anxiety, or depression.     Objective:   Physical Exam General Appearance:    Alert, cooperative, no distress, appears stated age  Head:    Normocephalic, without obvious abnormality, atraumatic  Eyes:    PERRL, conjunctiva/corneas clear, EOM's intact, fundi    benign, both eyes  Ears:    Normal TM's and external ear canals, both ears  Nose:   Nares normal, septum midline, mucosa normal, no drainage    or sinus tenderness  Throat:   Lips, mucosa, and tongue normal; teeth and gums normal  Neck:   Supple, symmetrical, trachea midline, no adenopathy;    Thyroid: + thyromegaly  Back:     Symmetric, no curvature, ROM normal, no CVA tenderness  Lungs:     Clear to auscultation bilaterally, respirations unlabored  Chest Wall:    No tenderness or deformity   Heart:    Regular rate and rhythm, S1 and S2 normal, no murmur, rub   or gallop  Breast Exam:    Deferred to GYN  Abdomen:     Soft, non-tender, bowel sounds active all four quadrants,    no masses, no organomegaly  Genitalia:    Deferred to GYN  Rectal:    Extremities:   Extremities normal, atraumatic, no cyanosis or edema  Pulses:   2+ and symmetric all extremities  Skin:   Skin color, texture, turgor normal, no rashes or lesions   Lymph nodes:   Cervical, supraclavicular, and axillary nodes normal  Neurologic:   CNII-XII intact, normal strength, sensation and reflexes    throughout          Assessment & Plan:

## 2016-11-04 NOTE — Patient Instructions (Signed)
Follow up in 1 year or as needed We'll notify you of your lab results and make any changes if needed We'll call you with your ultrasound appt for the thyroid Continue to work on healthy diet and regular exercise- you can do it! Call with any questions or concerns Happy New Year!!!

## 2016-11-04 NOTE — Addendum Note (Signed)
Addended by: Davis Gourd on: 11/04/2016 04:04 PM   Modules accepted: Orders

## 2016-11-04 NOTE — Assessment & Plan Note (Signed)
Due for repeat US.  Order entered

## 2016-11-04 NOTE — Progress Notes (Signed)
Pre visit review using our clinic review tool, if applicable. No additional management support is needed unless otherwise documented below in the visit note. 

## 2016-11-05 ENCOUNTER — Other Ambulatory Visit: Payer: Self-pay | Admitting: General Practice

## 2016-11-05 LAB — VITAMIN D 25 HYDROXY (VIT D DEFICIENCY, FRACTURES): Vit D, 25-Hydroxy: 13 ng/mL — ABNORMAL LOW (ref 30–100)

## 2016-11-05 MED ORDER — VITAMIN D (ERGOCALCIFEROL) 1.25 MG (50000 UNIT) PO CAPS: 50000.0000 [IU] | ORAL_CAPSULE | ORAL | 0 refills | Status: DC

## 2016-11-07 ENCOUNTER — Ambulatory Visit (HOSPITAL_BASED_OUTPATIENT_CLINIC_OR_DEPARTMENT_OTHER)
Admission: RE | Admit: 2016-11-07 | Discharge: 2016-11-07 | Disposition: A | Discharge status: Home / Self Care | Payer: 59 | Source: Ambulatory Visit | Attending: Family Medicine | Admitting: Family Medicine

## 2016-11-07 DIAGNOSIS — E010 Iodine-deficiency related diffuse (endemic) goiter: Secondary | ICD-10-CM | POA: Insufficient documentation

## 2017-01-27 ENCOUNTER — Encounter: Payer: Self-pay | Admitting: Family Medicine

## 2017-03-21 ENCOUNTER — Telehealth: Payer: 59 | Admitting: Family

## 2017-03-21 DIAGNOSIS — R059 Cough, unspecified: Secondary | ICD-10-CM

## 2017-03-21 DIAGNOSIS — R05 Cough: Secondary | ICD-10-CM

## 2017-03-21 MED ORDER — AZITHROMYCIN 250 MG PO TABS: ORAL_TABLET | ORAL | 0 refills | Status: DC

## 2017-03-21 MED ORDER — BENZONATATE 100 MG PO CAPS: 100.0000 mg | ORAL_CAPSULE | Freq: Three times a day (TID) | ORAL | 0 refills | Status: DC | PRN

## 2017-03-21 NOTE — Progress Notes (Signed)

## 2017-06-29 IMAGING — US US SOFT TISSUE HEAD/NECK
1 series · 13 of 25 positions shown · non-contrast
Comparison: 10/29/2015

CLINICAL DATA: Thyromegaly, small right thyroid cyst

EXAM:
THYROID ULTRASOUND
TECHNIQUE: Ultrasound examination of the thyroid gland and adjacent soft
tissues was performed.

[Series 1: us soft tissue head/neck · 0.05mm/px · 13 of 42 slices shown]
[im 1/42]
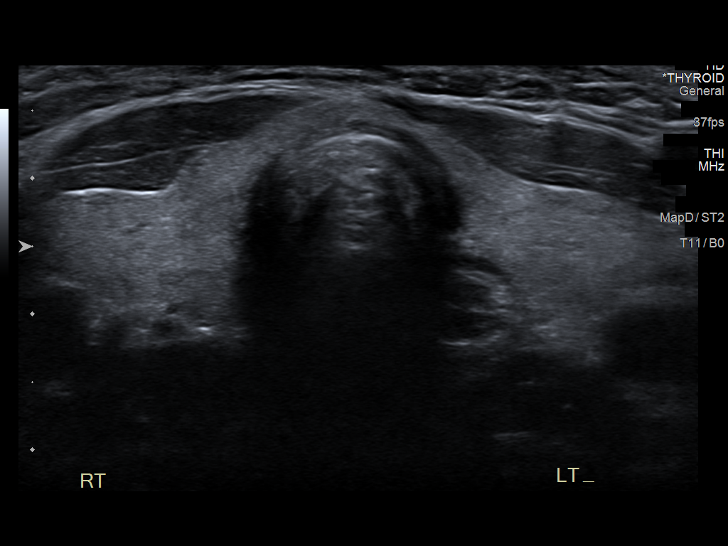
[im 4/42]
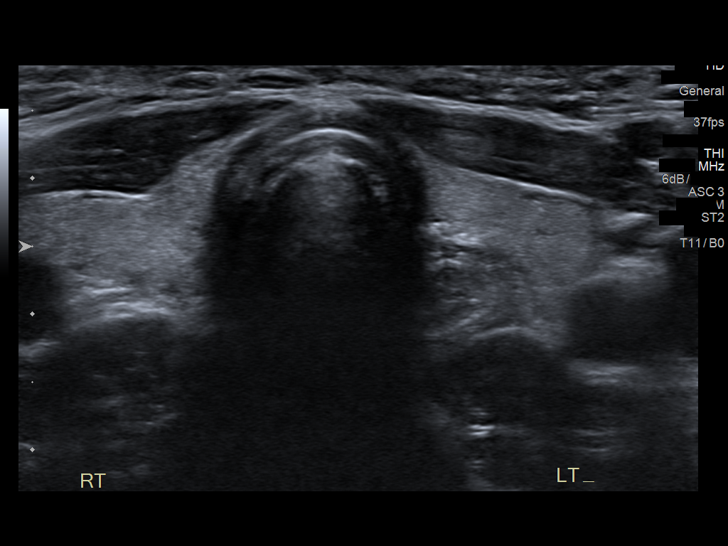
[im 7/42]
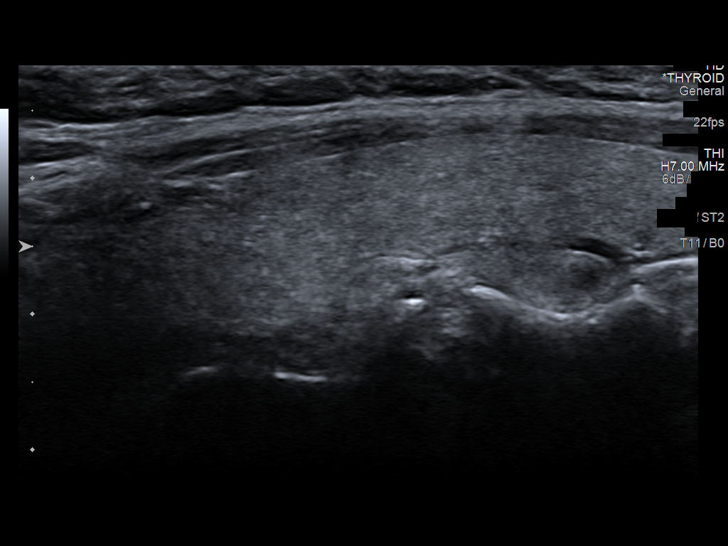
[im 11/42]
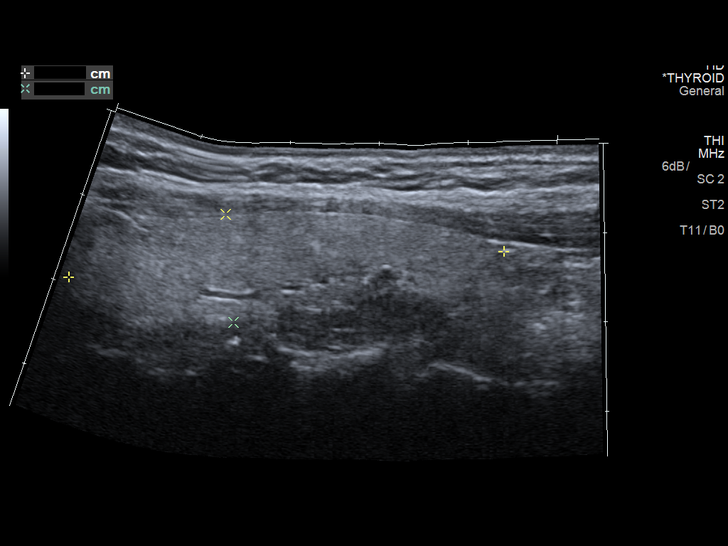
[im 14/42]
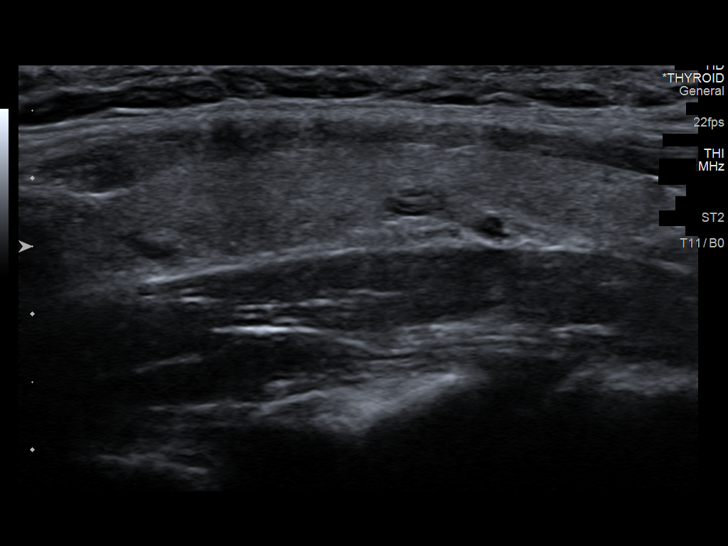
[im 18/42]
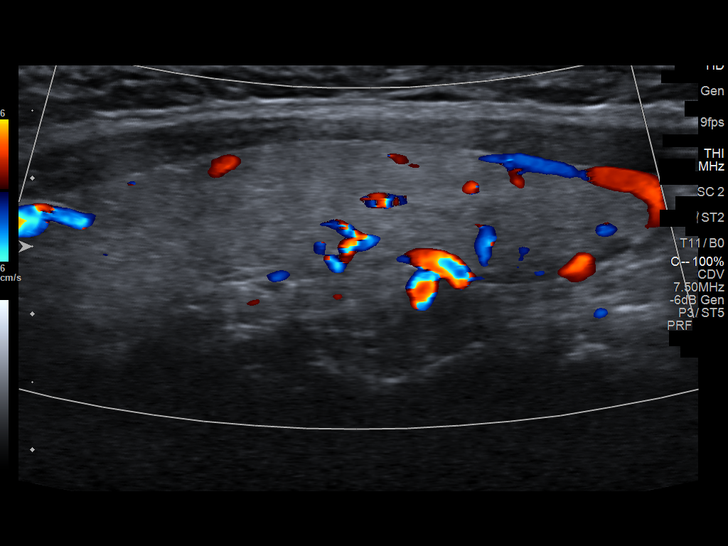
[im 21/42]
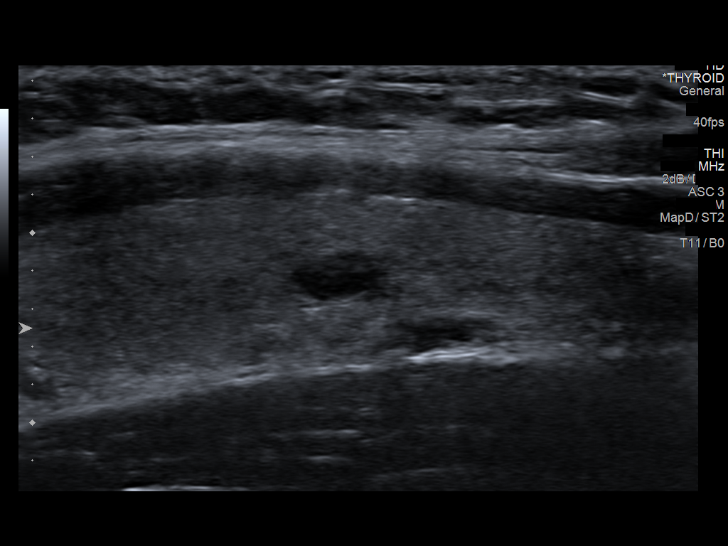
[im 24/42]
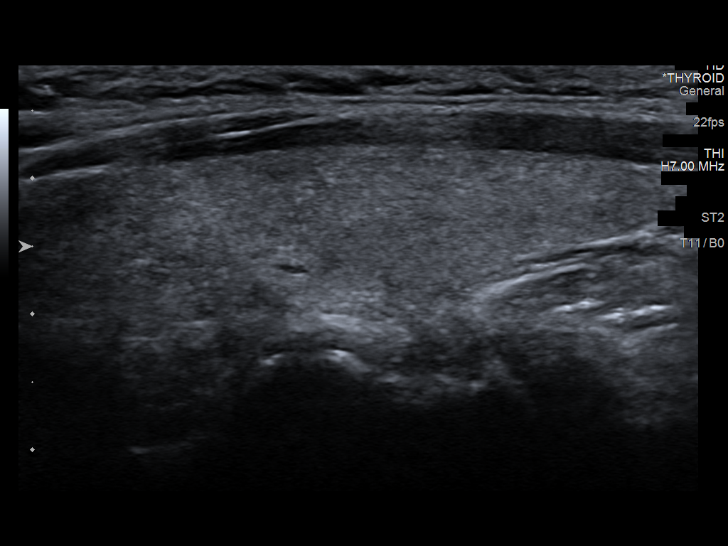
[im 28/42]
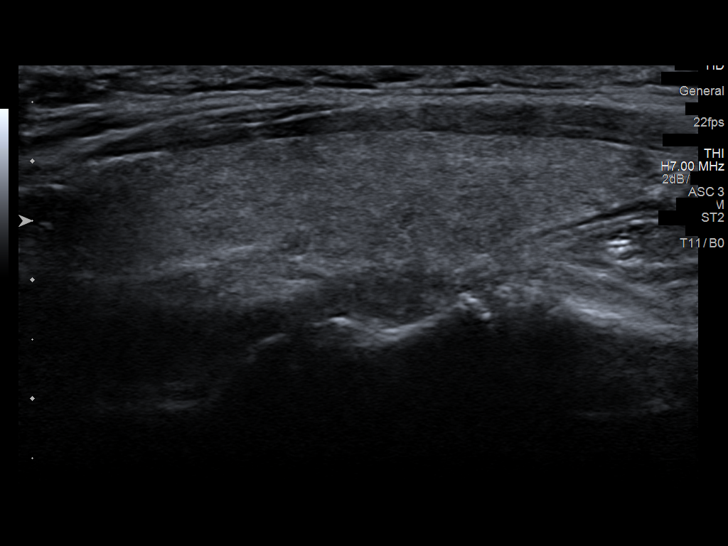
[im 31/42]
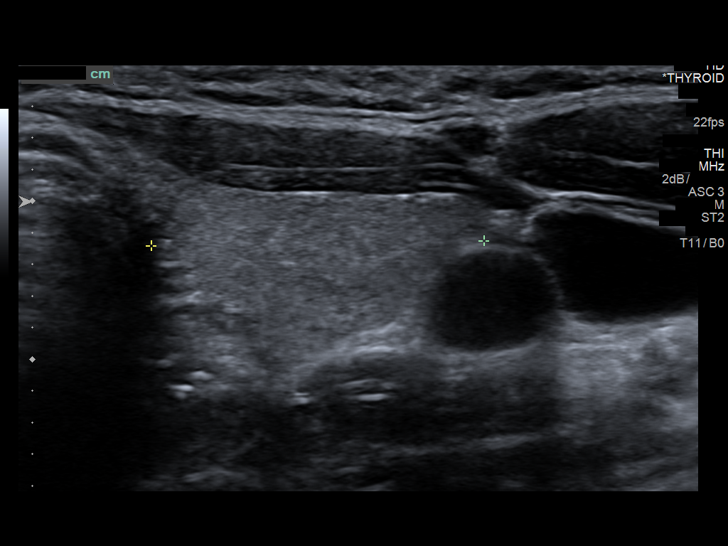
[im 35/42]
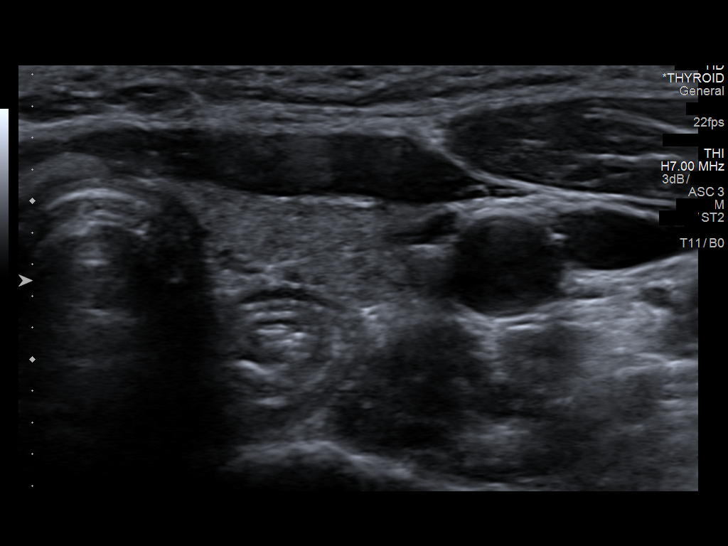
[im 38/42]
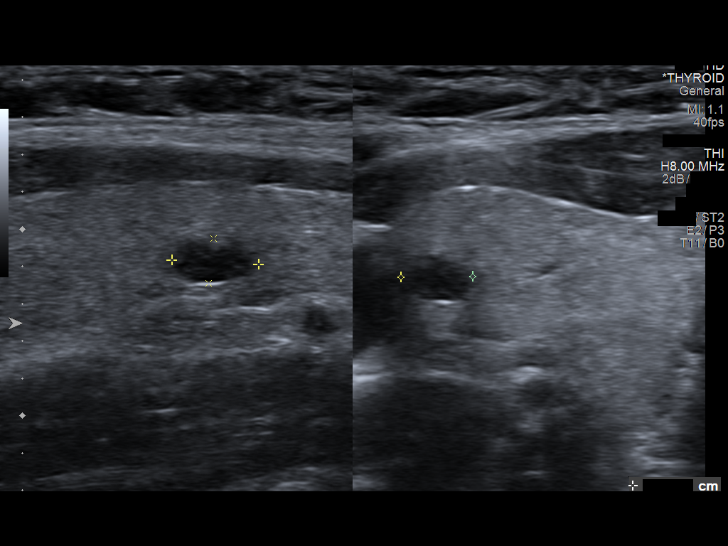
[im 42/42]
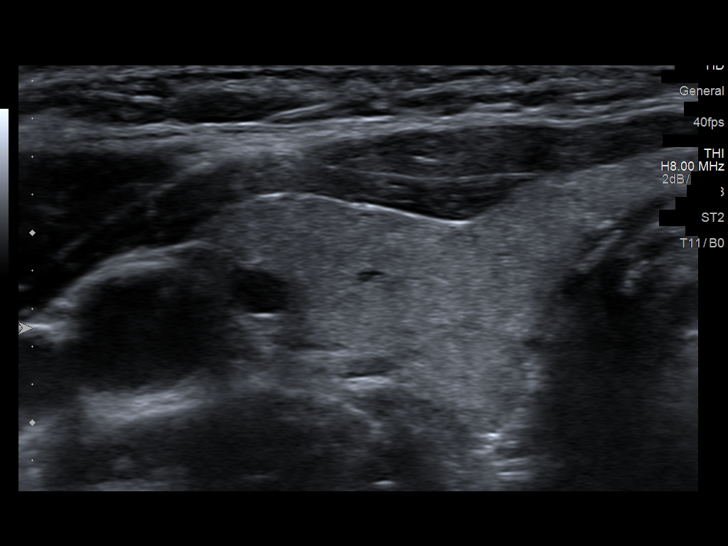

[13 of 25 positions shown; findings below may reference images not displayed]

FINDINGS: Parenchymal Echotexture: Mildly heterogenous

Isthmus: 3 mm, previously 2 mm

Right lobe: 4.9 x 1.2 x 1.7 cm, previously 4.4 x 1.2 x 1.8 cm

Left lobe: 5.4 x 1.2 x 2.1 cm, previously 4.3 x 1.4 x 1.8 cm

_________________________________________________________

Estimated total number of nodules >/= 1 cm: 0

Number of spongiform nodules >/=  2 cm not described below (TR1): 0

Number of mixed cystic and solid nodules >/= 1.5 cm not described
below (TR2): 0

Nodule # 1:

Location: Right; Mid

Maximum size: 0.5, previously 0.2 Cm; Other 2 dimensions: 0.2 x 0.4,
previously 0.2 x 0.2 cm

Composition: spongiform (0)

Echogenicity: anechoic (0)

Shape: not taller-than-wide (0)

Margins: ill-defined (0)

Echogenic foci: none (0)

ACR TI-RADS total points: 0.

ACR TI-RADS risk category: TR1 (0-1 points).

ACR TI-RADS recommendations:

This nodule does NOT meet TI-RADS criteria for biopsy or dedicated
follow-up.

No other significant thyroid abnormality.
IMPRESSION: 5 mm right mid thyroid cyst (TR 1), very minimal enlargement
compared to 10/29/2015. This small subcentimeter cystic nodule does
not meet criteria for additional imaging, follow-up or biopsy.

No other significant thyroid abnormality.

The above is in keeping with the ACR TI-RADS recommendations - [HOSPITAL] 2809;[DATE].

## 2017-06-30 ENCOUNTER — Encounter: Payer: Self-pay | Admitting: Family Medicine

## 2017-07-13 ENCOUNTER — Ambulatory Visit (INDEPENDENT_AMBULATORY_CARE_PROVIDER_SITE_OTHER): Payer: 59 | Admitting: Physician Assistant

## 2017-07-13 ENCOUNTER — Encounter: Payer: Self-pay | Admitting: Physician Assistant

## 2017-07-13 VITALS — BP 120/86 | HR 88 | Temp 98.8°F | Resp 14

## 2017-07-13 DIAGNOSIS — B9689 Other specified bacterial agents as the cause of diseases classified elsewhere: Secondary | ICD-10-CM | POA: Diagnosis not present

## 2017-07-13 DIAGNOSIS — J019 Acute sinusitis, unspecified: Secondary | ICD-10-CM

## 2017-07-13 MED ORDER — DOXYCYCLINE HYCLATE 100 MG PO CAPS: 100.0000 mg | ORAL_CAPSULE | Freq: Two times a day (BID) | ORAL | 0 refills | Status: DC

## 2017-07-13 NOTE — Progress Notes (Signed)
Pre visit review using our clinic review tool, if applicable. No additional management support is needed unless otherwise documented below in the visit note. 

## 2017-07-13 NOTE — Patient Instructions (Signed)
Please take antibiotic as directed.  Increase fluid intake.  Use Saline nasal spray.  Take a daily multivitamin. Restart Flonase daily. Start Xyzal daily as directed.  Place a humidifier in the bedroom.  Please call or return clinic if symptoms are not improving.  Sinusitis Sinusitis is redness, soreness, and swelling (inflammation) of the paranasal sinuses. Paranasal sinuses are air pockets within the bones of your face (beneath the eyes, the middle of the forehead, or above the eyes). In healthy paranasal sinuses, mucus is able to drain out, and air is able to circulate through them by way of your nose. However, when your paranasal sinuses are inflamed, mucus and air can become trapped. This can allow bacteria and other germs to grow and cause infection. Sinusitis can develop quickly and last only a short time (acute) or continue over a long period (chronic). Sinusitis that lasts for more than 12 weeks is considered chronic.  CAUSES  Causes of sinusitis include:  Allergies.  Structural abnormalities, such as displacement of the cartilage that separates your nostrils (deviated septum), which can decrease the air flow through your nose and sinuses and affect sinus drainage.  Functional abnormalities, such as when the small hairs (cilia) that line your sinuses and help remove mucus do not work properly or are not present. SYMPTOMS  Symptoms of acute and chronic sinusitis are the same. The primary symptoms are pain and pressure around the affected sinuses. Other symptoms include:  Upper toothache.  Earache.  Headache.  Bad breath.  Decreased sense of smell and taste.  A cough, which worsens when you are lying flat.  Fatigue.  Fever.  Thick drainage from your nose, which often is green and may contain pus (purulent).  Swelling and warmth over the affected sinuses. DIAGNOSIS  Your caregiver will perform a physical exam. During the exam, your caregiver may:  Look in your nose for  signs of abnormal growths in your nostrils (nasal polyps).  Tap over the affected sinus to check for signs of infection.  View the inside of your sinuses (endoscopy) with a special imaging device with a light attached (endoscope), which is inserted into your sinuses. If your caregiver suspects that you have chronic sinusitis, one or more of the following tests may be recommended:  Allergy tests.  Nasal culture A sample of mucus is taken from your nose and sent to a lab and screened for bacteria.  Nasal cytology A sample of mucus is taken from your nose and examined by your caregiver to determine if your sinusitis is related to an allergy. TREATMENT  Most cases of acute sinusitis are related to a viral infection and will resolve on their own within 10 days. Sometimes medicines are prescribed to help relieve symptoms (pain medicine, decongestants, nasal steroid sprays, or saline sprays).  However, for sinusitis related to a bacterial infection, your caregiver will prescribe antibiotic medicines. These are medicines that will help kill the bacteria causing the infection.  Rarely, sinusitis is caused by a fungal infection. In theses cases, your caregiver will prescribe antifungal medicine. For some cases of chronic sinusitis, surgery is needed. Generally, these are cases in which sinusitis recurs more than 3 times per year, despite other treatments. HOME CARE INSTRUCTIONS   Drink plenty of water. Water helps thin the mucus so your sinuses can drain more easily.  Use a humidifier.  Inhale steam 3 to 4 times a day (for example, sit in the bathroom with the shower running).  Apply a warm, moist washcloth to  your face 3 to 4 times a day, or as directed by your caregiver.  Use saline nasal sprays to help moisten and clean your sinuses.  Take over-the-counter or prescription medicines for pain, discomfort, or fever only as directed by your caregiver. SEEK IMMEDIATE MEDICAL CARE IF:  You have  increasing pain or severe headaches.  You have nausea, vomiting, or drowsiness.  You have swelling around your face.  You have vision problems.  You have a stiff neck.  You have difficulty breathing. MAKE SURE YOU:   Understand these instructions.  Will watch your condition.  Will get help right away if you are not doing well or get worse. Document Released: 09/28/2005 Document Revised: 12/21/2011 Document Reviewed: 10/13/2011 [facility] Patient Information 2014 [place], [place].

## 2017-07-13 NOTE — Progress Notes (Signed)
Patient presents to clinic today c/o 4 weeks of sinus pressure, nasal congestion with PND and rhinorrhea. Endorses occasional fever at night with some headache. Denies cough or chest congestion. Denies chest pain or SOB.   Past Medical History:  Diagnosis Date  . Allergy   . Asthma   . Chicken pox   . Dermoid cyst    age 36; removed ovary, tube and appendix  . HPV (human papilloma virus) infection    Normal pap smears x 2 years.    Current Outpatient Prescriptions on File Prior to Visit  Medication Sig Dispense Refill  . albuterol (PROVENTIL HFA;VENTOLIN HFA) 108 (90 BASE) MCG/ACT inhaler Inhale 2 puffs into the lungs every 6 (six) hours as needed for wheezing or shortness of breath. 1 Inhaler 2  . levonorgestrel-ethinyl estradiol (SEASONALE,INTROVALE,JOLESSA) 0.15-0.03 MG tablet Take 1 tablet by mouth daily.      No current facility-administered medications on file prior to visit.     Allergies  Allergen Reactions  . Amoxicillin     Double Vision    Family History  Problem Relation Age of Onset  . Sickle cell anemia Mother   . Arthritis Mother   . Cancer Maternal Grandmother        lymphoma  . Alzheimer's disease Maternal Grandmother   . Cancer Paternal Grandmother        uterine  . Diabetes Paternal Grandmother   . COPD Paternal Grandmother   . Uterine cancer Paternal Grandmother   . Hypertension Paternal Grandmother   . Asthma Brother   . Asthma Brother   . Sickle cell trait Brother     Social History   Social History  . Marital status: Single    Spouse name: N/A  . Number of children: N/A  . Years of education: N/A   Social History Main Topics  . Smoking status: Never Smoker  . Smokeless tobacco: Never Used  . Alcohol use Yes     Comment: drinks very rarely  . Drug use: No  . Sexual activity: Yes    Birth control/ protection: Pill   Other Topics Concern  . None   Social History Narrative  . None   Review of Systems - See HPI.  All other ROS  are negative.  BP 120/86   Pulse 88   Temp 98.8 F (37.1 C) (Oral)   Resp 14   SpO2 98%   Physical Exam  Constitutional: She is oriented to person, place, and time and well-developed, well-nourished, and in no distress.  HENT:  Head: Normocephalic and atraumatic.  Right Ear: External ear normal.  Left Ear: External ear normal.  Nose: Mucosal edema and rhinorrhea present. Right sinus exhibits frontal sinus tenderness. Left sinus exhibits frontal sinus tenderness.  Mouth/Throat: Uvula is midline and oropharynx is clear and moist.  Eyes: Conjunctivae are normal.  Neck: Neck supple.  Cardiovascular: Normal rate, regular rhythm, normal heart sounds and intact distal pulses.   Pulmonary/Chest: Effort normal and breath sounds normal. No respiratory distress. She has no wheezes. She has no rales. She exhibits no tenderness.  Lymphadenopathy:    She has no cervical adenopathy.  Neurological: She is alert and oriented to person, place, and time.  Skin: Skin is warm and dry. No rash noted.  Psychiatric: Affect normal.  Vitals reviewed.  Assessment/Plan: 1. Acute bacterial sinusitis Rx Doxycycline.  Increase fluids.  Rest.  Saline nasal spray.  Probiotic.  Mucinex as directed.  Humidifier in bedroom.  Call or return to clinic  if symptoms are not improving.  - doxycycline (VIBRAMYCIN) 100 MG capsule; Take 1 capsule (100 mg total) by mouth 2 (two) times daily.  Dispense: 20 capsule; Refill: 0   Leeanne Rio, [place]

## 2017-08-26 DIAGNOSIS — Z01419 Encounter for gynecological examination (general) (routine) without abnormal findings: Secondary | ICD-10-CM | POA: Diagnosis not present

## 2017-11-05 ENCOUNTER — Encounter: Payer: Self-pay | Admitting: Family Medicine

## 2017-11-05 ENCOUNTER — Ambulatory Visit (INDEPENDENT_AMBULATORY_CARE_PROVIDER_SITE_OTHER): Payer: 59 | Admitting: Family Medicine

## 2017-11-05 ENCOUNTER — Other Ambulatory Visit: Payer: Self-pay

## 2017-11-05 VITALS — BP 122/85 | HR 82 | Temp 98.7°F | Resp 16 | Ht 67.0 in | Wt 175.1 lb

## 2017-11-05 DIAGNOSIS — E663 Overweight: Secondary | ICD-10-CM | POA: Diagnosis not present

## 2017-11-05 DIAGNOSIS — E559 Vitamin D deficiency, unspecified: Secondary | ICD-10-CM

## 2017-11-05 DIAGNOSIS — Z0000 Encounter for general adult medical examination without abnormal findings: Secondary | ICD-10-CM | POA: Diagnosis not present

## 2017-11-05 LAB — CBC WITH DIFFERENTIAL/PLATELET
Basophils Absolute: 0 10*3/uL (ref 0.0–0.1)
Basophils Relative: 0.2 % (ref 0.0–3.0)
Eosinophils Absolute: 0.2 10*3/uL (ref 0.0–0.7)
Eosinophils Relative: 3 % (ref 0.0–5.0)
HCT: 41.4 % (ref 36.0–46.0)
Hemoglobin: 13.8 g/dL (ref 12.0–15.0)
Lymphocytes Relative: 45.3 % (ref 12.0–46.0)
Lymphs Abs: 2.4 10*3/uL (ref 0.7–4.0)
MCHC: 33.4 g/dL (ref 30.0–36.0)
MCV: 78.6 fl (ref 78.0–100.0)
Monocytes Absolute: 0.6 10*3/uL (ref 0.1–1.0)
Monocytes Relative: 11.3 % (ref 3.0–12.0)
Neutro Abs: 2.1 10*3/uL (ref 1.4–7.7)
Neutrophils Relative %: 40.2 % — ABNORMAL LOW (ref 43.0–77.0)
Platelets: 374 10*3/uL (ref 150.0–400.0)
RBC: 5.27 Mil/uL — ABNORMAL HIGH (ref 3.87–5.11)
RDW: 13.3 % (ref 11.5–15.5)
WBC: 5.3 10*3/uL (ref 4.0–10.5)

## 2017-11-05 LAB — LIPID PANEL
Cholesterol: 171 mg/dL (ref 0–200)
HDL: 63.3 mg/dL (ref >39.00)
LDL Cholesterol: 90 mg/dL (ref 0–99)
NonHDL: 108.13
Total CHOL/HDL Ratio: 3
Triglycerides: 93 mg/dL (ref 0.0–149.0)
VLDL: 18.6 mg/dL (ref 0.0–40.0)

## 2017-11-05 LAB — BASIC METABOLIC PANEL
BUN: 8 mg/dL (ref 6–23)
CO2: 29 mEq/L (ref 19–32)
Calcium: 9.3 mg/dL (ref 8.4–10.5)
Chloride: 101 mEq/L (ref 96–112)
Creatinine, Ser: 0.78 mg/dL (ref 0.40–1.20)
GFR: 107.76 mL/min (ref >60.00)
Glucose, Bld: 85 mg/dL (ref 70–99)
Potassium: 3.9 mEq/L (ref 3.5–5.1)
Sodium: 142 mEq/L (ref 135–145)

## 2017-11-05 LAB — VITAMIN D 25 HYDROXY (VIT D DEFICIENCY, FRACTURES): VITD: 18.35 ng/mL — ABNORMAL LOW (ref 30.00–100.00)

## 2017-11-05 LAB — HEPATIC FUNCTION PANEL
ALT: 9 U/L (ref 0–35)
AST: 10 U/L (ref 0–37)
Albumin: 4.3 g/dL (ref 3.5–5.2)
Alkaline Phosphatase: 52 U/L (ref 39–117)
Bilirubin, Direct: 0.1 mg/dL (ref 0.0–0.3)
Total Bilirubin: 0.9 mg/dL (ref 0.2–1.2)
Total Protein: 7.3 g/dL (ref 6.0–8.3)

## 2017-11-05 LAB — TSH: TSH: 1.5 u[IU]/mL (ref 0.35–4.50)

## 2017-11-05 MED ORDER — ALBUTEROL SULFATE HFA 108 (90 BASE) MCG/ACT IN AERS: 2.0000 | INHALATION_SPRAY | Freq: Four times a day (QID) | RESPIRATORY_TRACT | 2 refills | Status: AC | PRN

## 2017-11-05 NOTE — Assessment & Plan Note (Signed)
Pt has hx of Vit D deficiency.  Check labs and replete prn. 

## 2017-11-05 NOTE — Assessment & Plan Note (Signed)
Pt's PE WNL w/ exception of known thyromegaly and pt being overweight.  Check labs.  Anticipatory guidance provided.

## 2017-11-05 NOTE — Progress Notes (Signed)
   Subjective:    Patient ID: Kristen Floyd, female    DOB: 10-21-1980, 37 y.o.   MRN: [id number]  HPI CPE- UTD on pap.  Declines flu.  UTD on Tdap.   Review of Systems Patient reports no vision/ hearing changes, adenopathy,fever, weight change,  persistant/recurrent hoarseness , swallowing issues, chest pain, palpitations, edema, persistant/recurrent cough, hemoptysis, dyspnea (rest/exertional/paroxysmal nocturnal), gastrointestinal bleeding (melena, rectal bleeding), abdominal pain, significant heartburn, bowel changes, GU symptoms (dysuria, hematuria, incontinence), Gyn symptoms (abnormal  bleeding, pain),  syncope, focal weakness, memory loss, numbness & tingling, skin/hair/nail changes, abnormal bruising or bleeding, anxiety, or depression.     Objective:   Physical Exam General Appearance:    Alert, cooperative, no distress, appears stated age  Head:    Normocephalic, without obvious abnormality, atraumatic  Eyes:    PERRL, conjunctiva/corneas clear, EOM's intact, fundi    benign, both eyes  Ears:    Normal TM's and external ear canals, both ears  Nose:   Nares normal, septum midline, mucosa normal, no drainage    or sinus tenderness  Throat:   Lips, mucosa, and tongue normal; teeth and gums normal  Neck:   Supple, symmetrical, trachea midline, no adenopathy;    Thyroid: thyromegaly w/o nodularity  Back:     Symmetric, no curvature, ROM normal, no CVA tenderness  Lungs:     Clear to auscultation bilaterally, respirations unlabored  Chest Wall:    No tenderness or deformity   Heart:    Regular rate and rhythm, S1 and S2 normal, no murmur, rub   or gallop  Breast Exam:    Deferred to GYN  Abdomen:     Soft, non-tender, bowel sounds active all four quadrants,    no masses, no organomegaly  Genitalia:    Deferred to GYN  Rectal:    Extremities:   Extremities normal, atraumatic, no cyanosis or edema  Pulses:   2+ and symmetric all extremities  Skin:   Skin color, texture, turgor  normal, no rashes or lesions  Lymph nodes:   Cervical, supraclavicular, and axillary nodes normal  Neurologic:   CNII-XII intact, normal strength, sensation and reflexes    throughout          Assessment & Plan:

## 2017-11-05 NOTE — Patient Instructions (Signed)
Follow up in 1 year or as needed We'll notify you of your lab results and make any changes if needed Continue to work on healthy diet and regular exercise- you can do it! Call with any questions or concerns Happy New Year! 

## 2017-11-08 ENCOUNTER — Other Ambulatory Visit: Payer: Self-pay | Admitting: General Practice

## 2017-11-08 MED ORDER — VITAMIN D (ERGOCALCIFEROL) 1.25 MG (50000 UNIT) PO CAPS: 50000.0000 [IU] | ORAL_CAPSULE | ORAL | 0 refills | Status: DC

## 2017-11-09 DIAGNOSIS — N870 Mild cervical dysplasia: Secondary | ICD-10-CM | POA: Diagnosis not present

## 2017-11-09 DIAGNOSIS — Z124 Encounter for screening for malignant neoplasm of cervix: Secondary | ICD-10-CM | POA: Diagnosis not present

## 2017-11-09 DIAGNOSIS — R87612 Low grade squamous intraepithelial lesion on cytologic smear of cervix (LGSIL): Secondary | ICD-10-CM | POA: Diagnosis not present

## 2017-12-07 ENCOUNTER — Encounter: Payer: Self-pay | Admitting: Family Medicine

## 2017-12-07 DIAGNOSIS — J329 Chronic sinusitis, unspecified: Secondary | ICD-10-CM

## 2017-12-07 DIAGNOSIS — J302 Other seasonal allergic rhinitis: Secondary | ICD-10-CM

## 2017-12-15 ENCOUNTER — Encounter: Payer: Self-pay | Admitting: Family Medicine

## 2017-12-20 ENCOUNTER — Ambulatory Visit (INDEPENDENT_AMBULATORY_CARE_PROVIDER_SITE_OTHER): Payer: 59 | Admitting: Physician Assistant

## 2017-12-20 ENCOUNTER — Encounter: Payer: Self-pay | Admitting: Physician Assistant

## 2017-12-20 ENCOUNTER — Other Ambulatory Visit: Payer: Self-pay

## 2017-12-20 VITALS — BP 110/70 | HR 81 | Temp 98.4°F | Resp 16 | Ht 67.0 in | Wt 175.0 lb

## 2017-12-20 DIAGNOSIS — J0100 Acute maxillary sinusitis, unspecified: Secondary | ICD-10-CM | POA: Diagnosis not present

## 2017-12-20 MED ORDER — METHYLPREDNISOLONE 4 MG PO TBPK: ORAL_TABLET | ORAL | 0 refills | Status: DC

## 2017-12-20 NOTE — Progress Notes (Signed)
Patient presents to clinic today c/o continued nasal congestion and PND after treatment for acute sinusitis. Patient endorses several weeks of symptoms. States she had sinus pressure/sinus pain and tooth pain. Went to a Hotel manager. Was diagnosed with acute sinusitis and given a course of antibiotics. Notes resolution of all sinus pain with medication.Feels much better but states she cannot unclog her nose.   Past Medical History:  Diagnosis Date  . Allergy   . Asthma   . Chicken pox   . Dermoid cyst    age 18; removed ovary, tube and appendix  . HPV (human papilloma virus) infection    Normal pap smears x 2 years.    Current Outpatient Medications on File Prior to Visit  Medication Sig Dispense Refill  . albuterol (PROVENTIL HFA;VENTOLIN HFA) 108 (90 Base) MCG/ACT inhaler Inhale 2 puffs into the lungs every 6 (six) hours as needed for wheezing or shortness of breath. 1 Inhaler 2  . INTROVALE 0.15-0.03 MG tablet Take 1 tablet by mouth daily.  5  . Vitamin D, Ergocalciferol, (DRISDOL) 50000 units CAPS capsule Take 1 capsule (50,000 Units total) by mouth every 7 (seven) days. 12 capsule 0  . Cholecalciferol (VITAMIN D-3) 5000 units TABS Take 1 tablet by mouth every other day.     No current facility-administered medications on file prior to visit.     Allergies  Allergen Reactions  . Amoxicillin Other (See Comments)    Double Vision    Family History  Problem Relation Age of Onset  . Sickle cell anemia Mother   . Arthritis Mother   . Cancer Maternal Grandmother        lymphoma  . Alzheimer's disease Maternal Grandmother   . Cancer Paternal Grandmother        uterine  . Diabetes Paternal Grandmother   . COPD Paternal Grandmother   . Uterine cancer Paternal Grandmother   . Hypertension Paternal Grandmother   . Asthma Brother   . Asthma Brother   . Sickle cell trait Brother     Social History   Socioeconomic History  . Marital status: Single    Spouse name:  None  . Number of children: None  . Years of education: None  . Highest education level: None  Social Needs  . Financial resource strain: None  . Food insecurity - worry: None  . Food insecurity - inability: None  . Transportation needs - medical: None  . Transportation needs - non-medical: None  Occupational History  . None  Tobacco Use  . Smoking status: Never Smoker  . Smokeless tobacco: Never Used  Substance and Sexual Activity  . Alcohol use: Yes    Comment: drinks very rarely  . Drug use: No  . Sexual activity: Yes    Birth control/protection: Pill  Other Topics Concern  . None  Social History Narrative  . None   Review of Systems - See HPI.  All other ROS are negative.  BP 110/70   Pulse 81   Temp 98.4 F (36.9 C) (Oral)   Resp 16   Ht 5\' 7"  (1.702 m)   Wt 175 lb (79.4 kg)   SpO2 99%   BMI 27.41 kg/m   Physical Exam  Constitutional: She is oriented to person, place, and time and well-developed, well-nourished, and in no distress.  HENT:  Head: Normocephalic and atraumatic.  Right Ear: Tympanic membrane normal.  Left Ear: Tympanic membrane normal.  Nose: Mucosal edema (severe) and rhinorrhea present. No septal  deviation. No epistaxis.  No foreign bodies. Right sinus exhibits no maxillary sinus tenderness and no frontal sinus tenderness. Left sinus exhibits no maxillary sinus tenderness and no frontal sinus tenderness.  Mouth/Throat: Uvula is midline, oropharynx is clear and moist and mucous membranes are normal.  Neck: Neck supple.  Cardiovascular: Normal rate, regular rhythm, normal heart sounds and intact distal pulses.  Pulmonary/Chest: Effort normal and breath sounds normal. No respiratory distress. She has no wheezes. She has no rales. She exhibits no tenderness.  Neurological: She is alert and oriented to person, place, and time.  Skin: Skin is warm and dry. No rash noted.  Psychiatric: Affect normal.  Vitals reviewed.   Recent Results (from the  past 2160 hour(s))  Lipid panel     Status: None   Collection Time: 11/05/17  1:52 PM  Result Value Ref Range   Cholesterol 171 0 - 200 mg/dL    Comment: ATP III Classification       Desirable:  < 200 mg/dL               Borderline High:  200 - 239 mg/dL          High:  > = 240 mg/dL   Triglycerides 93.0 0.0 - 149.0 mg/dL    Comment: Normal:  <150 mg/dLBorderline High:  150 - 199 mg/dL   HDL 63.30 >39.00 mg/dL   VLDL 18.6 0.0 - 40.0 mg/dL   LDL Cholesterol 90 0 - 99 mg/dL   Total CHOL/HDL Ratio 3     Comment:                Men          Women1/2 Average Risk     3.4          3.3Average Risk          5.0          4.42X Average Risk          9.6          7.13X Average Risk          15.0          11.0                       NonHDL 108.13     Comment: NOTE:  Non-HDL goal should be 30 mg/dL higher than patient's LDL goal (i.e. LDL goal of < 70 mg/dL, would have non-HDL goal of < 100 mg/dL)  Basic metabolic panel     Status: None   Collection Time: 11/05/17  1:52 PM  Result Value Ref Range   Sodium 142 135 - 145 mEq/L   Potassium 3.9 3.5 - 5.1 mEq/L   Chloride 101 96 - 112 mEq/L   CO2 29 19 - 32 mEq/L   Glucose, Bld 85 70 - 99 mg/dL   BUN 8 6 - 23 mg/dL   Creatinine, Ser 0.78 0.40 - 1.20 mg/dL   Calcium 9.3 8.4 - 10.5 mg/dL   GFR 107.76 >60.00 mL/min  TSH     Status: None   Collection Time: 11/05/17  1:52 PM  Result Value Ref Range   TSH 1.50 0.35 - 4.50 uIU/mL  Hepatic function panel     Status: None   Collection Time: 11/05/17  1:52 PM  Result Value Ref Range   Total Bilirubin 0.9 0.2 - 1.2 mg/dL   Bilirubin, Direct 0.1 0.0 - 0.3 mg/dL   Alkaline Phosphatase 52  39 - 117 U/L   AST 10 0 - 37 U/L   ALT 9 0 - 35 U/L   Total Protein 7.3 6.0 - 8.3 g/dL   Albumin 4.3 3.5 - 5.2 g/dL  CBC with Differential/Platelet     Status: Abnormal   Collection Time: 11/05/17  1:52 PM  Result Value Ref Range   WBC 5.3 4.0 - 10.5 K/uL   RBC 5.27 (H) 3.87 - 5.11 Mil/uL   Hemoglobin 13.8 12.0 - 15.0  g/dL   HCT 41.4 36.0 - 46.0 %   MCV 78.6 78.0 - 100.0 fl   MCHC 33.4 30.0 - 36.0 g/dL   RDW 13.3 11.5 - 15.5 %   Platelets 374.0 150.0 - 400.0 K/uL   Neutrophils Relative % 40.2 (L) 43.0 - 77.0 %   Lymphocytes Relative 45.3 12.0 - 46.0 %   Monocytes Relative 11.3 3.0 - 12.0 %   Eosinophils Relative 3.0 0.0 - 5.0 %   Basophils Relative 0.2 0.0 - 3.0 %   Neutro Abs 2.1 1.4 - 7.7 K/uL   Lymphs Abs 2.4 0.7 - 4.0 K/uL   Monocytes Absolute 0.6 0.1 - 1.0 K/uL   Eosinophils Absolute 0.2 0.0 - 0.7 K/uL   Basophils Absolute 0.0 0.0 - 0.1 K/uL  VITAMIN D 25 Hydroxy (Vit-D Deficiency, Fractures)     Status: Abnormal   Collection Time: 11/05/17  1:52 PM  Result Value Ref Range   VITD 18.35 (L) 30.00 - 100.00 ng/mL   Assessment/Plan: 1. Acute non-recurrent maxillary sinusitis Bacterial infections resolved with antibiotics. Drainage is a major issue due to amount of mucosal swelling in the nose. Continue Flonase. Claritin-D recommended. Start Medrol dose pack to help with swelling and promote drainage. If not resolving, will need ENT assessment.  - methylPREDNISolone (MEDROL DOSEPAK) 4 MG TBPK tablet; Take following package directions.  Dispense: 21 tablet; Refill: 0   Leeanne Rio, PA-C

## 2017-12-20 NOTE — Patient Instructions (Signed)
Please take the steroid pack as directed to help with nasal turbinate swelling and sinus inflammation.  Restart Flonase and your [employer].  Take daily antihistamine like Xyzal. This should all allow the congestion to drain/be blown out.  If not resolving, we will need to get imaging of the sinuses.   Follow-up with the allergist as scheduled.

## 2017-12-28 ENCOUNTER — Ambulatory Visit: Payer: Self-pay | Admitting: Allergy and Immunology

## 2018-01-07 DIAGNOSIS — J4520 Mild intermittent asthma, uncomplicated: Secondary | ICD-10-CM | POA: Diagnosis not present

## 2018-01-07 DIAGNOSIS — J301 Allergic rhinitis due to pollen: Secondary | ICD-10-CM | POA: Diagnosis not present

## 2018-01-07 DIAGNOSIS — J3089 Other allergic rhinitis: Secondary | ICD-10-CM | POA: Diagnosis not present

## 2018-01-07 DIAGNOSIS — J3081 Allergic rhinitis due to animal (cat) (dog) hair and dander: Secondary | ICD-10-CM | POA: Diagnosis not present

## 2018-01-19 DIAGNOSIS — J301 Allergic rhinitis due to pollen: Secondary | ICD-10-CM | POA: Diagnosis not present

## 2018-01-19 DIAGNOSIS — J3081 Allergic rhinitis due to animal (cat) (dog) hair and dander: Secondary | ICD-10-CM | POA: Diagnosis not present

## 2018-01-20 DIAGNOSIS — J3089 Other allergic rhinitis: Secondary | ICD-10-CM | POA: Diagnosis not present

## 2018-01-23 ENCOUNTER — Other Ambulatory Visit: Payer: Self-pay | Admitting: Family Medicine

## 2018-01-26 DIAGNOSIS — J3089 Other allergic rhinitis: Secondary | ICD-10-CM | POA: Diagnosis not present

## 2018-01-26 DIAGNOSIS — J3081 Allergic rhinitis due to animal (cat) (dog) hair and dander: Secondary | ICD-10-CM | POA: Diagnosis not present

## 2018-01-26 DIAGNOSIS — J301 Allergic rhinitis due to pollen: Secondary | ICD-10-CM | POA: Diagnosis not present

## 2018-02-02 DIAGNOSIS — J3081 Allergic rhinitis due to animal (cat) (dog) hair and dander: Secondary | ICD-10-CM | POA: Diagnosis not present

## 2018-02-02 DIAGNOSIS — J3089 Other allergic rhinitis: Secondary | ICD-10-CM | POA: Diagnosis not present

## 2018-02-02 DIAGNOSIS — J301 Allergic rhinitis due to pollen: Secondary | ICD-10-CM | POA: Diagnosis not present

## 2018-02-09 DIAGNOSIS — J3081 Allergic rhinitis due to animal (cat) (dog) hair and dander: Secondary | ICD-10-CM | POA: Diagnosis not present

## 2018-02-09 DIAGNOSIS — J3089 Other allergic rhinitis: Secondary | ICD-10-CM | POA: Diagnosis not present

## 2018-02-09 DIAGNOSIS — J301 Allergic rhinitis due to pollen: Secondary | ICD-10-CM | POA: Diagnosis not present

## 2018-02-16 DIAGNOSIS — J3089 Other allergic rhinitis: Secondary | ICD-10-CM | POA: Diagnosis not present

## 2018-02-16 DIAGNOSIS — J3081 Allergic rhinitis due to animal (cat) (dog) hair and dander: Secondary | ICD-10-CM | POA: Diagnosis not present

## 2018-02-16 DIAGNOSIS — J301 Allergic rhinitis due to pollen: Secondary | ICD-10-CM | POA: Diagnosis not present

## 2018-02-23 DIAGNOSIS — J3089 Other allergic rhinitis: Secondary | ICD-10-CM | POA: Diagnosis not present

## 2018-02-23 DIAGNOSIS — J301 Allergic rhinitis due to pollen: Secondary | ICD-10-CM | POA: Diagnosis not present

## 2018-02-23 DIAGNOSIS — J3081 Allergic rhinitis due to animal (cat) (dog) hair and dander: Secondary | ICD-10-CM | POA: Diagnosis not present

## 2018-03-02 DIAGNOSIS — J3081 Allergic rhinitis due to animal (cat) (dog) hair and dander: Secondary | ICD-10-CM | POA: Diagnosis not present

## 2018-03-02 DIAGNOSIS — J3089 Other allergic rhinitis: Secondary | ICD-10-CM | POA: Diagnosis not present

## 2018-03-02 DIAGNOSIS — J301 Allergic rhinitis due to pollen: Secondary | ICD-10-CM | POA: Diagnosis not present

## 2018-03-09 DIAGNOSIS — J301 Allergic rhinitis due to pollen: Secondary | ICD-10-CM | POA: Diagnosis not present

## 2018-03-09 DIAGNOSIS — J3081 Allergic rhinitis due to animal (cat) (dog) hair and dander: Secondary | ICD-10-CM | POA: Diagnosis not present

## 2018-03-09 DIAGNOSIS — J3089 Other allergic rhinitis: Secondary | ICD-10-CM | POA: Diagnosis not present

## 2018-03-16 DIAGNOSIS — J3089 Other allergic rhinitis: Secondary | ICD-10-CM | POA: Diagnosis not present

## 2018-03-16 DIAGNOSIS — J3081 Allergic rhinitis due to animal (cat) (dog) hair and dander: Secondary | ICD-10-CM | POA: Diagnosis not present

## 2018-03-16 DIAGNOSIS — J301 Allergic rhinitis due to pollen: Secondary | ICD-10-CM | POA: Diagnosis not present

## 2018-03-23 DIAGNOSIS — J3089 Other allergic rhinitis: Secondary | ICD-10-CM | POA: Diagnosis not present

## 2018-03-23 DIAGNOSIS — J3081 Allergic rhinitis due to animal (cat) (dog) hair and dander: Secondary | ICD-10-CM | POA: Diagnosis not present

## 2018-03-23 DIAGNOSIS — J301 Allergic rhinitis due to pollen: Secondary | ICD-10-CM | POA: Diagnosis not present

## 2018-03-30 DIAGNOSIS — J3081 Allergic rhinitis due to animal (cat) (dog) hair and dander: Secondary | ICD-10-CM | POA: Diagnosis not present

## 2018-03-30 DIAGNOSIS — J3089 Other allergic rhinitis: Secondary | ICD-10-CM | POA: Diagnosis not present

## 2018-03-30 DIAGNOSIS — J301 Allergic rhinitis due to pollen: Secondary | ICD-10-CM | POA: Diagnosis not present

## 2018-04-06 DIAGNOSIS — J3089 Other allergic rhinitis: Secondary | ICD-10-CM | POA: Diagnosis not present

## 2018-04-06 DIAGNOSIS — J301 Allergic rhinitis due to pollen: Secondary | ICD-10-CM | POA: Diagnosis not present

## 2018-04-06 DIAGNOSIS — J3081 Allergic rhinitis due to animal (cat) (dog) hair and dander: Secondary | ICD-10-CM | POA: Diagnosis not present

## 2018-04-13 DIAGNOSIS — J3089 Other allergic rhinitis: Secondary | ICD-10-CM | POA: Diagnosis not present

## 2018-04-13 DIAGNOSIS — J301 Allergic rhinitis due to pollen: Secondary | ICD-10-CM | POA: Diagnosis not present

## 2018-04-13 DIAGNOSIS — J3081 Allergic rhinitis due to animal (cat) (dog) hair and dander: Secondary | ICD-10-CM | POA: Diagnosis not present

## 2018-04-20 DIAGNOSIS — J3081 Allergic rhinitis due to animal (cat) (dog) hair and dander: Secondary | ICD-10-CM | POA: Diagnosis not present

## 2018-04-20 DIAGNOSIS — J3089 Other allergic rhinitis: Secondary | ICD-10-CM | POA: Diagnosis not present

## 2018-04-20 DIAGNOSIS — J301 Allergic rhinitis due to pollen: Secondary | ICD-10-CM | POA: Diagnosis not present

## 2018-04-27 DIAGNOSIS — J301 Allergic rhinitis due to pollen: Secondary | ICD-10-CM | POA: Diagnosis not present

## 2018-04-27 DIAGNOSIS — J3089 Other allergic rhinitis: Secondary | ICD-10-CM | POA: Diagnosis not present

## 2018-04-27 DIAGNOSIS — J3081 Allergic rhinitis due to animal (cat) (dog) hair and dander: Secondary | ICD-10-CM | POA: Diagnosis not present

## 2018-05-04 DIAGNOSIS — J301 Allergic rhinitis due to pollen: Secondary | ICD-10-CM | POA: Diagnosis not present

## 2018-05-04 DIAGNOSIS — J3081 Allergic rhinitis due to animal (cat) (dog) hair and dander: Secondary | ICD-10-CM | POA: Diagnosis not present

## 2018-05-04 DIAGNOSIS — J3089 Other allergic rhinitis: Secondary | ICD-10-CM | POA: Diagnosis not present

## 2018-05-11 DIAGNOSIS — J3081 Allergic rhinitis due to animal (cat) (dog) hair and dander: Secondary | ICD-10-CM | POA: Diagnosis not present

## 2018-05-11 DIAGNOSIS — J3089 Other allergic rhinitis: Secondary | ICD-10-CM | POA: Diagnosis not present

## 2018-05-11 DIAGNOSIS — J301 Allergic rhinitis due to pollen: Secondary | ICD-10-CM | POA: Diagnosis not present

## 2018-05-18 DIAGNOSIS — J3089 Other allergic rhinitis: Secondary | ICD-10-CM | POA: Diagnosis not present

## 2018-05-18 DIAGNOSIS — J3081 Allergic rhinitis due to animal (cat) (dog) hair and dander: Secondary | ICD-10-CM | POA: Diagnosis not present

## 2018-05-18 DIAGNOSIS — J301 Allergic rhinitis due to pollen: Secondary | ICD-10-CM | POA: Diagnosis not present

## 2018-06-01 DIAGNOSIS — J3081 Allergic rhinitis due to animal (cat) (dog) hair and dander: Secondary | ICD-10-CM | POA: Diagnosis not present

## 2018-06-01 DIAGNOSIS — J301 Allergic rhinitis due to pollen: Secondary | ICD-10-CM | POA: Diagnosis not present

## 2018-06-01 DIAGNOSIS — J3089 Other allergic rhinitis: Secondary | ICD-10-CM | POA: Diagnosis not present

## 2018-06-08 DIAGNOSIS — J3089 Other allergic rhinitis: Secondary | ICD-10-CM | POA: Diagnosis not present

## 2018-06-08 DIAGNOSIS — J301 Allergic rhinitis due to pollen: Secondary | ICD-10-CM | POA: Diagnosis not present

## 2018-06-08 DIAGNOSIS — J3081 Allergic rhinitis due to animal (cat) (dog) hair and dander: Secondary | ICD-10-CM | POA: Diagnosis not present

## 2018-06-15 DIAGNOSIS — J3081 Allergic rhinitis due to animal (cat) (dog) hair and dander: Secondary | ICD-10-CM | POA: Diagnosis not present

## 2018-06-15 DIAGNOSIS — J301 Allergic rhinitis due to pollen: Secondary | ICD-10-CM | POA: Diagnosis not present

## 2018-06-15 DIAGNOSIS — J3089 Other allergic rhinitis: Secondary | ICD-10-CM | POA: Diagnosis not present

## 2018-06-22 DIAGNOSIS — J3089 Other allergic rhinitis: Secondary | ICD-10-CM | POA: Diagnosis not present

## 2018-06-22 DIAGNOSIS — J301 Allergic rhinitis due to pollen: Secondary | ICD-10-CM | POA: Diagnosis not present

## 2018-06-22 DIAGNOSIS — J3081 Allergic rhinitis due to animal (cat) (dog) hair and dander: Secondary | ICD-10-CM | POA: Diagnosis not present

## 2018-06-29 DIAGNOSIS — J3089 Other allergic rhinitis: Secondary | ICD-10-CM | POA: Diagnosis not present

## 2018-06-29 DIAGNOSIS — J3081 Allergic rhinitis due to animal (cat) (dog) hair and dander: Secondary | ICD-10-CM | POA: Diagnosis not present

## 2018-06-29 DIAGNOSIS — J301 Allergic rhinitis due to pollen: Secondary | ICD-10-CM | POA: Diagnosis not present

## 2018-07-06 DIAGNOSIS — J3089 Other allergic rhinitis: Secondary | ICD-10-CM | POA: Diagnosis not present

## 2018-07-06 DIAGNOSIS — J3081 Allergic rhinitis due to animal (cat) (dog) hair and dander: Secondary | ICD-10-CM | POA: Diagnosis not present

## 2018-07-06 DIAGNOSIS — J301 Allergic rhinitis due to pollen: Secondary | ICD-10-CM | POA: Diagnosis not present

## 2018-07-13 DIAGNOSIS — J3081 Allergic rhinitis due to animal (cat) (dog) hair and dander: Secondary | ICD-10-CM | POA: Diagnosis not present

## 2018-07-13 DIAGNOSIS — J301 Allergic rhinitis due to pollen: Secondary | ICD-10-CM | POA: Diagnosis not present

## 2018-07-13 DIAGNOSIS — J3089 Other allergic rhinitis: Secondary | ICD-10-CM | POA: Diagnosis not present

## 2018-07-20 DIAGNOSIS — J301 Allergic rhinitis due to pollen: Secondary | ICD-10-CM | POA: Diagnosis not present

## 2018-07-20 DIAGNOSIS — J3089 Other allergic rhinitis: Secondary | ICD-10-CM | POA: Diagnosis not present

## 2018-07-20 DIAGNOSIS — J3081 Allergic rhinitis due to animal (cat) (dog) hair and dander: Secondary | ICD-10-CM | POA: Diagnosis not present

## 2018-07-25 DIAGNOSIS — J3089 Other allergic rhinitis: Secondary | ICD-10-CM | POA: Diagnosis not present

## 2018-07-25 DIAGNOSIS — J301 Allergic rhinitis due to pollen: Secondary | ICD-10-CM | POA: Diagnosis not present

## 2018-07-25 DIAGNOSIS — J3081 Allergic rhinitis due to animal (cat) (dog) hair and dander: Secondary | ICD-10-CM | POA: Diagnosis not present

## 2018-08-03 DIAGNOSIS — J301 Allergic rhinitis due to pollen: Secondary | ICD-10-CM | POA: Diagnosis not present

## 2018-08-03 DIAGNOSIS — J3081 Allergic rhinitis due to animal (cat) (dog) hair and dander: Secondary | ICD-10-CM | POA: Diagnosis not present

## 2018-08-03 DIAGNOSIS — J3089 Other allergic rhinitis: Secondary | ICD-10-CM | POA: Diagnosis not present

## 2018-08-10 DIAGNOSIS — J301 Allergic rhinitis due to pollen: Secondary | ICD-10-CM | POA: Diagnosis not present

## 2018-08-10 DIAGNOSIS — J3081 Allergic rhinitis due to animal (cat) (dog) hair and dander: Secondary | ICD-10-CM | POA: Diagnosis not present

## 2018-08-10 DIAGNOSIS — J3089 Other allergic rhinitis: Secondary | ICD-10-CM | POA: Diagnosis not present

## 2018-08-11 DIAGNOSIS — J3089 Other allergic rhinitis: Secondary | ICD-10-CM | POA: Diagnosis not present

## 2018-08-17 DIAGNOSIS — J3081 Allergic rhinitis due to animal (cat) (dog) hair and dander: Secondary | ICD-10-CM | POA: Diagnosis not present

## 2018-08-17 DIAGNOSIS — J301 Allergic rhinitis due to pollen: Secondary | ICD-10-CM | POA: Diagnosis not present

## 2018-08-17 DIAGNOSIS — J3089 Other allergic rhinitis: Secondary | ICD-10-CM | POA: Diagnosis not present

## 2018-08-24 DIAGNOSIS — J3081 Allergic rhinitis due to animal (cat) (dog) hair and dander: Secondary | ICD-10-CM | POA: Diagnosis not present

## 2018-08-24 DIAGNOSIS — J301 Allergic rhinitis due to pollen: Secondary | ICD-10-CM | POA: Diagnosis not present

## 2018-08-24 DIAGNOSIS — J3089 Other allergic rhinitis: Secondary | ICD-10-CM | POA: Diagnosis not present

## 2018-08-31 DIAGNOSIS — J3081 Allergic rhinitis due to animal (cat) (dog) hair and dander: Secondary | ICD-10-CM | POA: Diagnosis not present

## 2018-08-31 DIAGNOSIS — J3089 Other allergic rhinitis: Secondary | ICD-10-CM | POA: Diagnosis not present

## 2018-08-31 DIAGNOSIS — J301 Allergic rhinitis due to pollen: Secondary | ICD-10-CM | POA: Diagnosis not present

## 2018-09-06 DIAGNOSIS — J301 Allergic rhinitis due to pollen: Secondary | ICD-10-CM | POA: Diagnosis not present

## 2018-09-06 DIAGNOSIS — J3089 Other allergic rhinitis: Secondary | ICD-10-CM | POA: Diagnosis not present

## 2018-09-06 DIAGNOSIS — J3081 Allergic rhinitis due to animal (cat) (dog) hair and dander: Secondary | ICD-10-CM | POA: Diagnosis not present

## 2018-09-14 DIAGNOSIS — J301 Allergic rhinitis due to pollen: Secondary | ICD-10-CM | POA: Diagnosis not present

## 2018-09-14 DIAGNOSIS — J3089 Other allergic rhinitis: Secondary | ICD-10-CM | POA: Diagnosis not present

## 2018-09-14 DIAGNOSIS — J3081 Allergic rhinitis due to animal (cat) (dog) hair and dander: Secondary | ICD-10-CM | POA: Diagnosis not present

## 2018-09-21 DIAGNOSIS — J301 Allergic rhinitis due to pollen: Secondary | ICD-10-CM | POA: Diagnosis not present

## 2018-09-21 DIAGNOSIS — J3081 Allergic rhinitis due to animal (cat) (dog) hair and dander: Secondary | ICD-10-CM | POA: Diagnosis not present

## 2018-09-21 DIAGNOSIS — J3089 Other allergic rhinitis: Secondary | ICD-10-CM | POA: Diagnosis not present

## 2018-09-21 DIAGNOSIS — Z124 Encounter for screening for malignant neoplasm of cervix: Secondary | ICD-10-CM | POA: Diagnosis not present

## 2018-09-21 DIAGNOSIS — Z01419 Encounter for gynecological examination (general) (routine) without abnormal findings: Secondary | ICD-10-CM | POA: Diagnosis not present

## 2018-09-21 LAB — HM PAP SMEAR

## 2018-09-23 ENCOUNTER — Encounter: Payer: Self-pay | Admitting: General Practice

## 2018-09-28 DIAGNOSIS — J3089 Other allergic rhinitis: Secondary | ICD-10-CM | POA: Diagnosis not present

## 2018-09-28 DIAGNOSIS — J3081 Allergic rhinitis due to animal (cat) (dog) hair and dander: Secondary | ICD-10-CM | POA: Diagnosis not present

## 2018-09-28 DIAGNOSIS — J301 Allergic rhinitis due to pollen: Secondary | ICD-10-CM | POA: Diagnosis not present

## 2018-10-26 DIAGNOSIS — J3089 Other allergic rhinitis: Secondary | ICD-10-CM | POA: Diagnosis not present

## 2018-10-26 DIAGNOSIS — J3081 Allergic rhinitis due to animal (cat) (dog) hair and dander: Secondary | ICD-10-CM | POA: Diagnosis not present

## 2018-10-26 DIAGNOSIS — J301 Allergic rhinitis due to pollen: Secondary | ICD-10-CM | POA: Diagnosis not present

## 2018-11-07 ENCOUNTER — Other Ambulatory Visit: Payer: Self-pay

## 2018-11-07 ENCOUNTER — Ambulatory Visit (INDEPENDENT_AMBULATORY_CARE_PROVIDER_SITE_OTHER): Payer: 59 | Admitting: Family Medicine

## 2018-11-07 ENCOUNTER — Encounter: Payer: Self-pay | Admitting: Family Medicine

## 2018-11-07 VITALS — BP 120/82 | HR 67 | Temp 98.3°F | Resp 16 | Ht 67.0 in | Wt 178.2 lb

## 2018-11-07 DIAGNOSIS — E663 Overweight: Secondary | ICD-10-CM | POA: Diagnosis not present

## 2018-11-07 DIAGNOSIS — E559 Vitamin D deficiency, unspecified: Secondary | ICD-10-CM

## 2018-11-07 DIAGNOSIS — Z0000 Encounter for general adult medical examination without abnormal findings: Secondary | ICD-10-CM

## 2018-11-07 LAB — CBC WITH DIFFERENTIAL/PLATELET
Basophils Absolute: 0 10*3/uL (ref 0.0–0.1)
Basophils Relative: 0.3 % (ref 0.0–3.0)
Eosinophils Absolute: 0.2 10*3/uL (ref 0.0–0.7)
Eosinophils Relative: 3.2 % (ref 0.0–5.0)
HCT: 42 % (ref 36.0–46.0)
Hemoglobin: 14 g/dL (ref 12.0–15.0)
Lymphocytes Relative: 39 % (ref 12.0–46.0)
Lymphs Abs: 2.1 10*3/uL (ref 0.7–4.0)
MCHC: 33.4 g/dL (ref 30.0–36.0)
MCV: 78.6 fl (ref 78.0–100.0)
Monocytes Absolute: 0.6 10*3/uL (ref 0.1–1.0)
Monocytes Relative: 11.1 % (ref 3.0–12.0)
Neutro Abs: 2.5 10*3/uL (ref 1.4–7.7)
Neutrophils Relative %: 46.4 % (ref 43.0–77.0)
Platelets: 385 10*3/uL (ref 150.0–400.0)
RBC: 5.34 Mil/uL — ABNORMAL HIGH (ref 3.87–5.11)
RDW: 13.4 % (ref 11.5–15.5)
WBC: 5.5 10*3/uL (ref 4.0–10.5)

## 2018-11-07 LAB — HEPATIC FUNCTION PANEL
ALT: 9 U/L (ref 0–35)
AST: 10 U/L (ref 0–37)
Albumin: 4.5 g/dL (ref 3.5–5.2)
Alkaline Phosphatase: 59 U/L (ref 39–117)
Bilirubin, Direct: 0.1 mg/dL (ref 0.0–0.3)
Total Bilirubin: 0.7 mg/dL (ref 0.2–1.2)
Total Protein: 7.3 g/dL (ref 6.0–8.3)

## 2018-11-07 LAB — TSH: TSH: 1.6 u[IU]/mL (ref 0.35–4.50)

## 2018-11-07 LAB — LIPID PANEL
Cholesterol: 178 mg/dL (ref 0–200)
HDL: 64.9 mg/dL (ref >39.00)
LDL Cholesterol: 100 mg/dL — ABNORMAL HIGH (ref 0–99)
NonHDL: 113
Total CHOL/HDL Ratio: 3
Triglycerides: 67 mg/dL (ref 0.0–149.0)
VLDL: 13.4 mg/dL (ref 0.0–40.0)

## 2018-11-07 LAB — BASIC METABOLIC PANEL
BUN: 9 mg/dL (ref 6–23)
CO2: 27 mEq/L (ref 19–32)
Calcium: 9.8 mg/dL (ref 8.4–10.5)
Chloride: 104 mEq/L (ref 96–112)
Creatinine, Ser: 0.83 mg/dL (ref 0.40–1.20)
GFR: 93.84 mL/min (ref >60.00)
Glucose, Bld: 78 mg/dL (ref 70–99)
Potassium: 4.1 mEq/L (ref 3.5–5.1)
Sodium: 139 mEq/L (ref 135–145)

## 2018-11-07 LAB — VITAMIN D 25 HYDROXY (VIT D DEFICIENCY, FRACTURES): VITD: 27.14 ng/mL — ABNORMAL LOW (ref 30.00–100.00)

## 2018-11-07 NOTE — Assessment & Plan Note (Signed)
Pt's PE WNL w/ exception of known thyromegaly.  UTD on pap, immunizations.  Check labs.  Anticipatory guidance provided.

## 2018-11-07 NOTE — Assessment & Plan Note (Signed)
Pt has hx of this.  Check labs and replete prn. 

## 2018-11-07 NOTE — Patient Instructions (Signed)
Follow up in 1 year or as needed We'll notify you of your lab results and make any changes if needed Keep up the good work on healthy diet and regular exercise- you can do it! Call with any questions or concerns Hang in there!!  Things have to get better!!!

## 2018-11-07 NOTE — Progress Notes (Signed)
   Subjective:    Patient ID: Kristen Floyd, female    DOB: Jan 22, 1981, 38 y.o.   MRN: [id number]  HPI CPE- UTD on pap, Tdap.  Declines flu.   Review of Systems Patient reports no vision/ hearing changes, adenopathy,fever, weight change,  persistant/recurrent hoarseness , swallowing issues, chest pain, palpitations, edema, persistant/recurrent cough, hemoptysis, dyspnea (rest/exertional/paroxysmal nocturnal), gastrointestinal bleeding (melena, rectal bleeding), abdominal pain, significant heartburn, bowel changes, GU symptoms (dysuria, hematuria, incontinence), Gyn symptoms (abnormal  bleeding, pain),  syncope, focal weakness, memory loss, numbness & tingling, skin/hair/nail changes, abnormal bruising or bleeding, anxiety, or depression.     Objective:   Physical Exam General Appearance:    Alert, cooperative, no distress, appears stated age  Head:    Normocephalic, without obvious abnormality, atraumatic  Eyes:    PERRL, conjunctiva/corneas clear, EOM's intact, fundi    benign, both eyes  Ears:    Normal TM's and external ear canals, both ears  Nose:   Nares normal, septum midline, mucosa normal, no drainage    or sinus tenderness  Throat:   Lips, mucosa, and tongue normal; teeth and gums normal  Neck:   Supple, symmetrical, trachea midline, no adenopathy;    Thyroid: diffuse thyromegaly w/o discrete nodularity  Back:     Symmetric, no curvature, ROM normal, no CVA tenderness  Lungs:     Clear to auscultation bilaterally, respirations unlabored  Chest Wall:    No tenderness or deformity   Heart:    Regular rate and rhythm, S1 and S2 normal, no murmur, rub   or gallop  Breast Exam:    Deferred to GYN  Abdomen:     Soft, non-tender, bowel sounds active all four quadrants,    no masses, no organomegaly  Genitalia:    Deferred to GYN  Rectal:    Extremities:   Extremities normal, atraumatic, no cyanosis or edema  Pulses:   2+ and symmetric all extremities  Skin:   Skin color, texture,  turgor normal, no rashes or lesions  Lymph nodes:   Cervical, supraclavicular, and axillary nodes normal  Neurologic:   CNII-XII intact, normal strength, sensation and reflexes    throughout          Assessment & Plan:

## 2018-11-08 ENCOUNTER — Other Ambulatory Visit: Payer: Self-pay | Admitting: General Practice

## 2018-11-08 MED ORDER — VITAMIN D (ERGOCALCIFEROL) 1.25 MG (50000 UNIT) PO CAPS: 50000.0000 [IU] | ORAL_CAPSULE | ORAL | 0 refills | Status: DC

## 2018-11-09 DIAGNOSIS — J3089 Other allergic rhinitis: Secondary | ICD-10-CM | POA: Diagnosis not present

## 2018-11-09 DIAGNOSIS — J301 Allergic rhinitis due to pollen: Secondary | ICD-10-CM | POA: Diagnosis not present

## 2018-11-09 DIAGNOSIS — J3081 Allergic rhinitis due to animal (cat) (dog) hair and dander: Secondary | ICD-10-CM | POA: Diagnosis not present

## 2018-11-16 DIAGNOSIS — J301 Allergic rhinitis due to pollen: Secondary | ICD-10-CM | POA: Diagnosis not present

## 2018-11-16 DIAGNOSIS — J3089 Other allergic rhinitis: Secondary | ICD-10-CM | POA: Diagnosis not present

## 2018-11-16 DIAGNOSIS — J3081 Allergic rhinitis due to animal (cat) (dog) hair and dander: Secondary | ICD-10-CM | POA: Diagnosis not present

## 2018-11-30 DIAGNOSIS — J301 Allergic rhinitis due to pollen: Secondary | ICD-10-CM | POA: Diagnosis not present

## 2018-11-30 DIAGNOSIS — J3081 Allergic rhinitis due to animal (cat) (dog) hair and dander: Secondary | ICD-10-CM | POA: Diagnosis not present

## 2018-11-30 DIAGNOSIS — J3089 Other allergic rhinitis: Secondary | ICD-10-CM | POA: Diagnosis not present

## 2018-12-02 DIAGNOSIS — N72 Inflammatory disease of cervix uteri: Secondary | ICD-10-CM | POA: Diagnosis not present

## 2018-12-07 DIAGNOSIS — J3081 Allergic rhinitis due to animal (cat) (dog) hair and dander: Secondary | ICD-10-CM | POA: Diagnosis not present

## 2018-12-07 DIAGNOSIS — J301 Allergic rhinitis due to pollen: Secondary | ICD-10-CM | POA: Diagnosis not present

## 2018-12-07 DIAGNOSIS — J3089 Other allergic rhinitis: Secondary | ICD-10-CM | POA: Diagnosis not present

## 2018-12-20 DIAGNOSIS — J301 Allergic rhinitis due to pollen: Secondary | ICD-10-CM | POA: Diagnosis not present

## 2018-12-20 DIAGNOSIS — J3081 Allergic rhinitis due to animal (cat) (dog) hair and dander: Secondary | ICD-10-CM | POA: Diagnosis not present

## 2018-12-20 DIAGNOSIS — J3089 Other allergic rhinitis: Secondary | ICD-10-CM | POA: Diagnosis not present

## 2018-12-28 ENCOUNTER — Encounter: Payer: Self-pay | Admitting: Family Medicine

## 2018-12-28 DIAGNOSIS — J3081 Allergic rhinitis due to animal (cat) (dog) hair and dander: Secondary | ICD-10-CM | POA: Diagnosis not present

## 2018-12-28 DIAGNOSIS — J301 Allergic rhinitis due to pollen: Secondary | ICD-10-CM | POA: Diagnosis not present

## 2018-12-28 DIAGNOSIS — J3089 Other allergic rhinitis: Secondary | ICD-10-CM | POA: Diagnosis not present

## 2018-12-29 ENCOUNTER — Ambulatory Visit: Payer: Self-pay

## 2018-12-29 ENCOUNTER — Encounter: Payer: Self-pay | Admitting: Family Medicine

## 2018-12-29 ENCOUNTER — Other Ambulatory Visit: Payer: Self-pay

## 2018-12-29 ENCOUNTER — Ambulatory Visit (INDEPENDENT_AMBULATORY_CARE_PROVIDER_SITE_OTHER): Payer: 59 | Admitting: Family Medicine

## 2018-12-29 VITALS — BP 124/80 | HR 99 | Temp 99.1°F | Resp 17 | Ht 67.0 in | Wt 177.4 lb

## 2018-12-29 DIAGNOSIS — L03113 Cellulitis of right upper limb: Secondary | ICD-10-CM

## 2018-12-29 MED ORDER — DOXYCYCLINE HYCLATE 100 MG PO TABS: 100.0000 mg | ORAL_TABLET | Freq: Two times a day (BID) | ORAL | 0 refills | Status: DC

## 2018-12-29 NOTE — Progress Notes (Signed)
   Subjective:    Patient ID: Kristen Floyd, female    DOB: 1981/07/15, 38 y.o.   MRN: [id number]  HPI Arm infection- R upper arm.  Tuesday developed soreness.  Has derm appt 3/25.  Last night developed redness, warmth.  No drainage.   Review of Systems For ROS see HPI     Objective:   Physical Exam Vitals signs reviewed.  Constitutional:      General: She is not in acute distress.    Appearance: Normal appearance.  Cardiovascular:     Pulses: Normal pulses.  Skin:    General: Skin is warm and dry.     Findings: Erythema (5 cm area of induration w/ central pore of R upper arm.  very TTP. no fluctuance or pocket to drain) present.  Neurological:     Mental Status: She is alert.           Assessment & Plan:  Cellulitis- new.  No area to drain today.  Start abx.  Reviewed supportive care and red flags that should prompt return.  Pt expressed understanding and is in agreement w/ plan.

## 2018-12-29 NOTE — Telephone Encounter (Signed)
Pt. called to report redness, swelling, warmth, and firmness in right upper arm.  Stated that the area is itchy and painful.  Reported the swelling is the size of a tennis ball.  Rated the discomfort at 5-6/10.  Stated that she has had a cyst in this area "for years", and the flare-up of her symptoms started on Tuesday.  Reported temp. 98.9.  Denied any drainage from the site.   Called FC.  Rec'd confirmation that patient can be scheduled in the office this afternoon.  Appt. Scheduled at 2:00 PM with PCP.  Pt. Verb. Understanding; agrees with plan.          Reason for Disposition . [1] Red area or streak AND [2] fever  Answer Assessment - Initial Assessment Questions 1. ONSET: "When did the swelling start?" (e.g., minutes, hours, days)     Tuesday 2. LOCATION: "What part of the arm is swollen?"  "Are both arms swollen or just one arm?"     Upper right arm, red, firm and swollen about tennis ball size; pinpoint black spot  3. SEVERITY: "How bad is the swelling?" (e.g., localized; mild, moderate, severe)   - LOCALIZED: Small area of puffiness or swelling on just one arm   - JOINT SWELLING: Swelling of one joint   - MILD: Puffiness or swelling of hand   - MODERATE: Puffiness or swollen feeling of entire arm    - SEVERE: All of arm looks swollen; pitting edema     severe 4. REDNESS: "Does the swelling look red or infected?"     Redness  5. PAIN: "Is the swelling painful to touch?" If so, ask: "How painful is it?"   (Scale 1-10; mild, moderate or severe)     C/o itchy and painful; 5-6/ 10  6. FEVER: "Do you have a fever?" If so, ask: "What is it, how was it measured, and when did it start?"      Denied temperature; no chills  7. CAUSE: "What do you think is causing the arm swelling?"     Has had a cyst present in same region "for years"  8. MEDICAL HISTORY: "Do you have a history of heart failure, kidney disease, liver failure, or cancer?"     Has asthma; denied any other medical problems. 9.  RECURRENT SYMPTOM: "Have you had arm swelling before?" If so, ask: "When was the last time?" "What happened that time?"     Cyst has been present.  10. OTHER SYMPTOMS: "Do you have any other symptoms?" (e.g., chest pain, difficulty breathing)       Pain in site ; temp. 98.9 11. PREGNANCY: "Is there any chance you are pregnant?" "When was your last menstrual period?"      LMP; February  Protocols used: ARM SWELLING AND EDEMA-A-AH

## 2018-12-29 NOTE — Patient Instructions (Addendum)
Follow up w/ derm as scheduled START the Doxycycline twice daily- take w/ food Alternate warm and cold Tylenol or ibuprofen for pain relief Call with any questions or concerns Hang in there! STAY SAFE!!!

## 2019-01-02 ENCOUNTER — Encounter: Payer: Self-pay | Admitting: Family Medicine

## 2019-01-04 DIAGNOSIS — L0291 Cutaneous abscess, unspecified: Secondary | ICD-10-CM | POA: Diagnosis not present

## 2019-01-09 DIAGNOSIS — L910 Hypertrophic scar: Secondary | ICD-10-CM | POA: Diagnosis not present

## 2019-01-09 DIAGNOSIS — L0291 Cutaneous abscess, unspecified: Secondary | ICD-10-CM | POA: Diagnosis not present

## 2019-01-25 DIAGNOSIS — L0291 Cutaneous abscess, unspecified: Secondary | ICD-10-CM | POA: Diagnosis not present

## 2019-02-01 ENCOUNTER — Other Ambulatory Visit: Payer: Self-pay | Admitting: Family Medicine

## 2019-02-10 DIAGNOSIS — J301 Allergic rhinitis due to pollen: Secondary | ICD-10-CM | POA: Diagnosis not present

## 2019-02-10 DIAGNOSIS — J3089 Other allergic rhinitis: Secondary | ICD-10-CM | POA: Diagnosis not present

## 2019-02-10 DIAGNOSIS — J3081 Allergic rhinitis due to animal (cat) (dog) hair and dander: Secondary | ICD-10-CM | POA: Diagnosis not present

## 2019-02-15 DIAGNOSIS — J3081 Allergic rhinitis due to animal (cat) (dog) hair and dander: Secondary | ICD-10-CM | POA: Diagnosis not present

## 2019-02-15 DIAGNOSIS — J3089 Other allergic rhinitis: Secondary | ICD-10-CM | POA: Diagnosis not present

## 2019-02-15 DIAGNOSIS — J301 Allergic rhinitis due to pollen: Secondary | ICD-10-CM | POA: Diagnosis not present

## 2019-11-10 ENCOUNTER — Other Ambulatory Visit: Payer: Self-pay

## 2019-11-10 ENCOUNTER — Ambulatory Visit (INDEPENDENT_AMBULATORY_CARE_PROVIDER_SITE_OTHER): Payer: 59 | Admitting: Family Medicine

## 2019-11-10 ENCOUNTER — Encounter: Payer: Self-pay | Admitting: Family Medicine

## 2019-11-10 VITALS — BP 118/82 | HR 78 | Temp 97.8°F | Resp 16 | Ht 67.0 in | Wt 178.0 lb

## 2019-11-10 DIAGNOSIS — E559 Vitamin D deficiency, unspecified: Secondary | ICD-10-CM | POA: Diagnosis not present

## 2019-11-10 DIAGNOSIS — Z0000 Encounter for general adult medical examination without abnormal findings: Secondary | ICD-10-CM | POA: Diagnosis not present

## 2019-11-10 DIAGNOSIS — E663 Overweight: Secondary | ICD-10-CM | POA: Diagnosis not present

## 2019-11-10 DIAGNOSIS — E669 Obesity, unspecified: Secondary | ICD-10-CM | POA: Insufficient documentation

## 2019-11-10 NOTE — Assessment & Plan Note (Signed)
Pt w/ hx of this.  Check labs and replete prn.

## 2019-11-10 NOTE — Assessment & Plan Note (Signed)
Pt's PE WNL w/ exception of being overweight.  UTD on Tdap, pap.  Check labs.  Anticipatory guidance provided.

## 2019-11-10 NOTE — Progress Notes (Signed)
   Subjective:    Patient ID: Kristen Floyd, female    DOB: [birth year], 39 y.o.   MRN: [id number]  HPI CPE- UTD on pap, Tdap.  Declines flu.   Review of Systems Patient reports no vision/ hearing changes, adenopathy,fever, weight change,  persistant/recurrent hoarseness , swallowing issues, chest pain, palpitations, edema, persistant/recurrent cough, hemoptysis, dyspnea (rest/exertional/paroxysmal nocturnal), gastrointestinal bleeding (melena, rectal bleeding), abdominal pain, significant heartburn, bowel changes, GU symptoms (dysuria, hematuria, incontinence), Gyn symptoms (abnormal  bleeding, pain),  syncope, focal weakness, memory loss, numbness & tingling, skin/hair/nail changes, abnormal bruising or bleeding, anxiety, or depression.   This visit occurred during the SARS-CoV-2 public health emergency.  Safety protocols were in place, including screening questions prior to the visit, additional usage of staff PPE, and extensive cleaning of exam room while observing appropriate contact time as indicated for disinfecting solutions.       Objective:   Physical Exam General Appearance:    Alert, cooperative, no distress, appears stated age  Head:    Normocephalic, without obvious abnormality, atraumatic  Eyes:    PERRL, conjunctiva/corneas clear, EOM's intact, fundi    benign, both eyes  Ears:    Normal TM's and external ear canals, both ears  Nose:   Deferred due to COVID  Throat:   Neck:   Supple, symmetrical, trachea midline, no adenopathy;    Thyroid: diffuse enlargement, no nodularity  Back:     Symmetric, no curvature, ROM normal, no CVA tenderness  Lungs:     Clear to auscultation bilaterally, respirations unlabored  Chest Wall:    No tenderness or deformity   Heart:    Regular rate and rhythm, S1 and S2 normal, no murmur, rub   or gallop  Breast Exam:    Deferred to GYN  Abdomen:     Soft, non-tender, bowel sounds active all four quadrants,    no masses, no organomegaly    Genitalia:    Deferred to GYN  Rectal:    Extremities:   Extremities normal, atraumatic, no cyanosis or edema  Pulses:   2+ and symmetric all extremities  Skin:   Skin color, texture, turgor normal, no rashes or lesions  Lymph nodes:   Cervical, supraclavicular, and axillary nodes normal  Neurologic:   CNII-XII intact, normal strength, sensation and reflexes    throughout          Assessment & Plan:

## 2019-11-10 NOTE — Patient Instructions (Signed)
Follow up in 1 year or as needed We'll notify you of your lab results and make any changes if needed Continue to work on healthy diet and regular exercise- you can do it! Call with any questions or concerns Stay Safe!  Stay Healthy! 

## 2019-11-10 NOTE — Assessment & Plan Note (Signed)
Discussed need for healthy diet and regular exercise.  Check labs to risk stratify.  Will follow.

## 2019-11-11 LAB — CBC WITH DIFFERENTIAL/PLATELET
Absolute Monocytes: 616 cells/uL (ref 200–950)
Basophils Absolute: 22 cells/uL (ref 0–200)
Basophils Relative: 0.4 %
Eosinophils Absolute: 151 cells/uL (ref 15–500)
Eosinophils Relative: 2.7 %
HCT: 42.2 % (ref 35.0–45.0)
Hemoglobin: 14.1 g/dL (ref 11.7–15.5)
Lymphs Abs: 2134 cells/uL (ref 850–3900)
MCH: 26.5 pg — ABNORMAL LOW (ref 27.0–33.0)
MCHC: 33.4 g/dL (ref 32.0–36.0)
MCV: 79.2 fL — ABNORMAL LOW (ref 80.0–100.0)
MPV: 9 fL (ref 7.5–12.5)
Monocytes Relative: 11 %
Neutro Abs: 2677 cells/uL (ref 1500–7800)
Neutrophils Relative %: 47.8 %
Platelets: 420 10*3/uL — ABNORMAL HIGH (ref 140–400)
RBC: 5.33 10*6/uL — ABNORMAL HIGH (ref 3.80–5.10)
RDW: 12.6 % (ref 11.0–15.0)
Total Lymphocyte: 38.1 %
WBC: 5.6 10*3/uL (ref 3.8–10.8)

## 2019-11-11 LAB — LIPID PANEL
Cholesterol: 197 mg/dL (ref <200)
HDL: 56 mg/dL (ref >=50)
LDL Cholesterol (Calc): 122 mg/dL (calc) — ABNORMAL HIGH
Non-HDL Cholesterol (Calc): 141 mg/dL (calc) — ABNORMAL HIGH (ref <130)
Total CHOL/HDL Ratio: 3.5 (calc) (ref <5.0)
Triglycerides: 88 mg/dL (ref <150)

## 2019-11-11 LAB — HEPATIC FUNCTION PANEL
AG Ratio: 1.5 (calc) (ref 1.0–2.5)
ALT: 12 U/L (ref 6–29)
AST: 12 U/L (ref 10–30)
Albumin: 4.5 g/dL (ref 3.6–5.1)
Alkaline phosphatase (APISO): 68 U/L (ref 31–125)
Bilirubin, Direct: 0.1 mg/dL (ref 0.0–0.2)
Globulin: 3.1 g/dL (calc) (ref 1.9–3.7)
Indirect Bilirubin: 0.6 mg/dL (calc) (ref 0.2–1.2)
Total Bilirubin: 0.7 mg/dL (ref 0.2–1.2)
Total Protein: 7.6 g/dL (ref 6.1–8.1)

## 2019-11-11 LAB — BASIC METABOLIC PANEL
BUN: 8 mg/dL (ref 7–25)
CO2: 24 mmol/L (ref 20–32)
Calcium: 10.1 mg/dL (ref 8.6–10.2)
Chloride: 105 mmol/L (ref 98–110)
Creat: 1.07 mg/dL (ref 0.50–1.10)
Glucose, Bld: 81 mg/dL (ref 65–99)
Potassium: 4.1 mmol/L (ref 3.5–5.3)
Sodium: 139 mmol/L (ref 135–146)

## 2019-11-11 LAB — TSH: TSH: 1.55 mIU/L

## 2019-11-11 LAB — VITAMIN D 25 HYDROXY (VIT D DEFICIENCY, FRACTURES): Vit D, 25-Hydroxy: 20 ng/mL — ABNORMAL LOW (ref 30–100)

## 2019-11-13 ENCOUNTER — Other Ambulatory Visit: Payer: Self-pay | Admitting: General Practice

## 2019-11-13 MED ORDER — VITAMIN D (ERGOCALCIFEROL) 1.25 MG (50000 UNIT) PO CAPS: 50000.0000 [IU] | ORAL_CAPSULE | ORAL | 0 refills | Status: DC

## 2020-02-02 ENCOUNTER — Other Ambulatory Visit: Payer: Self-pay | Admitting: Family Medicine

## 2020-05-22 ENCOUNTER — Encounter: Payer: Self-pay | Admitting: Family Medicine

## 2020-06-10 ENCOUNTER — Encounter: Payer: Self-pay | Admitting: Family Medicine

## 2020-06-10 ENCOUNTER — Other Ambulatory Visit: Payer: Self-pay

## 2020-06-10 ENCOUNTER — Ambulatory Visit (HOSPITAL_COMMUNITY)
Admission: EM | Admit: 2020-06-10 | Discharge: 2020-06-10 | Discharge status: Against Medical Advice | Payer: No Typology Code available for payment source

## 2020-06-10 ENCOUNTER — Encounter: Payer: Self-pay | Admitting: Emergency Medicine

## 2020-06-10 ENCOUNTER — Emergency Department (INDEPENDENT_AMBULATORY_CARE_PROVIDER_SITE_OTHER)
Admission: EM | Admit: 2020-06-10 | Discharge: 2020-06-10 | Disposition: A | Discharge status: Home / Self Care | Payer: No Typology Code available for payment source | Source: Home / Self Care

## 2020-06-10 DIAGNOSIS — M62830 Muscle spasm of back: Secondary | ICD-10-CM | POA: Diagnosis not present

## 2020-06-10 MED ORDER — CYCLOBENZAPRINE HCL 5 MG PO TABS: 5.0000 mg | ORAL_TABLET | Freq: Two times a day (BID) | ORAL | 0 refills | Status: DC | PRN

## 2020-06-10 MED ORDER — METHYLPREDNISOLONE ACETATE 80 MG/ML IJ SUSP
80.0000 mg | Freq: Once | INTRAMUSCULAR | Status: AC
  Administered 2020-06-10: 80 mg via INTRAMUSCULAR

## 2020-06-10 MED ORDER — KETOROLAC TROMETHAMINE 60 MG/2ML IM SOLN
60.0000 mg | Freq: Once | INTRAMUSCULAR | Status: AC
  Administered 2020-06-10: 60 mg via INTRAMUSCULAR

## 2020-06-10 NOTE — ED Triage Notes (Addendum)
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

## 2020-06-10 NOTE — ED Provider Notes (Signed)
[facility]    CSN: [id number] Arrival date & time: 06/10/20  1659      History   Chief Complaint Chief Complaint  Patient presents with  . Shoulder Pain    right    HPI Kristen Floyd is a 39 y.o. female.   HPI  Kristen Floyd is a 40 y.o. female presenting to UC with c/o Right posterior shoulder pain around her shoulder blade for about 5 days.  About 8 days ago she recalls waking up with a "crick" in her neck on the right side, that resolved within a few days but this pain has persisted, radiating into Right upper arm down to her elbow, aching and shooting, 4/10. She has used ice, heat, tylenol and ibuprofen without relief.  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 No known injury.  She is Right hand dominant.    Past Medical History:  Diagnosis Date  . Allergy   . Asthma   . Chicken pox   . Dermoid cyst    age 61; removed ovary, tube and appendix  . HPV (human papilloma virus) infection    Normal pap smears x 2 years.    Patient Active Problem List   Diagnosis Date Noted  . Overweight (BMI 25.0-29.9) 11/10/2019  . Vitamin D deficiency 11/05/2017  . Physical exam 10/29/2015  . Thyromegaly 10/29/2015  . Seasonal allergic rhinitis 07/18/2012    Past Surgical History:  Procedure Laterality Date  . APPENDECTOMY     age 46  . DERMOID CYST  EXCISION  age 53   right tube and ovary removed  . RT salpingoopherectomy     age 86  . WISDOM TOOTH EXTRACTION  2007    OB History    Gravida  0   Para  0   Term      Preterm      AB      Living        SAB      TAB      Ectopic      Multiple      Live Births               Home Medications    Prior to Admission medications   Medication Sig Start Date End Date Taking? Authorizing Provider  Cholecalciferol (VITAMIN D-3) 5000 units TABS Take 1 tablet by mouth every other day.   Yes [provider]    INTROVALE 0.15-0.03 MG tablet Take 1 tablet by mouth daily. 08/26/17  Yes [provider]  albuterol (PROVENTIL HFA;VENTOLIN HFA) 108 (90 Base) MCG/ACT inhaler Inhale 2 puffs into the lungs every 6 (six) hours as needed for wheezing or shortness of breath. 11/05/17   Midge Minium, MD  cyclobenzaprine (FLEXERIL) 5 MG tablet Take 1-2 tablets (5-10 mg total) by mouth 2 (two) times daily as needed for muscle spasms. 06/10/20   Noe Gens, PA-C  Vitamin D, Ergocalciferol, (DRISDOL) 1.25 MG (50000 UNIT) CAPS capsule Take 1 capsule (50,000 Units total) by mouth every 7 (seven) days. 11/13/19   Midge Minium, MD    Family History Family History  Problem Relation Age of Onset  . Sickle cell anemia Mother   . Arthritis Mother   . Cancer Maternal Grandmother        lymphoma  . Alzheimer's disease Maternal Grandmother   . Cancer Paternal Grandmother  uterine  . Diabetes Paternal Grandmother   . COPD Paternal Grandmother   . Uterine cancer Paternal Grandmother   . Hypertension Paternal Grandmother   . Asthma Brother   . Asthma Brother   . Sickle cell trait Brother     Social History Social History   Tobacco Use  . Smoking status: Never Smoker  . Smokeless tobacco: Never Used  Vaping Use  . Vaping Use: Never used  Substance Use Topics  . Alcohol use: Yes    Comment: drinks very rarely  . Drug use: No     Allergies   Amoxicillin   Review of Systems Review of Systems  Musculoskeletal: Positive for back pain and myalgias. Negative for arthralgias, neck pain and neck stiffness.  Skin: Negative for color change, rash and wound.  Neurological: Negative for weakness and numbness.     Physical Exam Triage Vital Signs ED Triage Vitals  Enc Vitals Group     BP 06/10/20 1720 135/85     Pulse Rate 06/10/20 1720 74     Resp 06/10/20 1720 17     Temp 06/10/20 1720 98.6 F (37 C)     Temp Source 06/10/20 1720 Oral     SpO2 06/10/20 1720 100 %     Weight  --      Height --      Head Circumference --      Peak Flow --      Pain Score 06/10/20 1722 4     Pain Loc --      Pain Edu? --      Excl. in [place]? --    No data found.  Updated Vital Signs BP 135/85 (BP Location: Left Arm)   Pulse 74   Temp 98.6 F (37 C) (Oral)   Resp 17   LMP 03/13/2020 (Exact Date) Comment: 4 times /yr  SpO2 100%   Visual Acuity Right Eye Distance:   Left Eye Distance:   Bilateral Distance:    Right Eye Near:   Left Eye Near:    Bilateral Near:     Physical Exam Vitals and nursing note reviewed.  Constitutional:      Appearance: Normal appearance. She is well-developed.  HENT:     Head: Normocephalic and atraumatic.  Cardiovascular:     Rate and Rhythm: Normal rate and regular rhythm.     Pulses:          Radial pulses are 2+ on the right side.  Pulmonary:     Effort: Pulmonary effort is normal.  Musculoskeletal:        General: Tenderness present. Normal range of motion.     Cervical back: Normal range of motion and neck supple. No rigidity or tenderness.       Back:     Comments: No spinal tenderness. Full ROM upper and lower extremities bilaterally.   Skin:    General: Skin is warm and dry.  Neurological:     Mental Status: She is alert and oriented to person, place, and time.  Psychiatric:        Behavior: Behavior normal.      UC Treatments / Results  Labs (all labs ordered are listed, but only abnormal results are displayed) Labs Reviewed - No data to display  EKG   Radiology No results found.  Procedures Procedures (including critical care time)  Medications Ordered in UC Medications  ketorolac (TORADOL) injection 60 mg (60 mg Intramuscular Given 06/10/20 1759)  methylPREDNISolone acetate (DEPO-MEDROL) injection  80 mg (80 mg Intramuscular Given 06/10/20 1800)    Initial Impression / Assessment and Plan / UC Course  I have reviewed the triage vital signs and the nursing notes.  Pertinent labs & imaging results  that were available during my care of the patient were reviewed by me and considered in my medical decision making (see chart for details).     Hx and exam c/w muscle spasm Depo-medrol 80mg  IM and toradol 60mg  IM given in UC Rx: flexeril F/u PCP or Sports Medicine  AVS given  Final Clinical Impressions(s) / UC Diagnoses   Final diagnoses:  Back muscle spasm     Discharge Instructions      Flexeril (cyclobenzaprine) is a muscle relaxer and may cause drowsiness. Do not drink alcohol, drive, or operate heavy machinery while taking.  You may take 500mg  acetaminophen every 4-6 hours or in combination with ibuprofen 400-600mg  every 6-8 hours as needed for pain and inflammation.  Call to schedule a follow up appointment with primary care later this week if not improving.      ED Prescriptions    Medication Sig Dispense Auth. Provider   cyclobenzaprine (FLEXERIL) 5 MG tablet Take 1-2 tablets (5-10 mg total) by mouth 2 (two) times daily as needed for muscle spasms. 30 tablet Noe Gens, [place]     PDMP not reviewed this encounter.   Noe Gens, [place] 06/10/20 [phone]

## 2020-06-10 NOTE — Discharge Instructions (Signed)
  Flexeril (cyclobenzaprine) is a muscle relaxer and may cause drowsiness. Do not drink alcohol, drive, or operate heavy machinery while taking.  You may take 500mg  acetaminophen every 4-6 hours or in combination with ibuprofen 400-600mg  every 6-8 hours as needed for pain and inflammation.  Call to schedule a follow up appointment with primary care later this week if not improving.

## 2020-06-11 ENCOUNTER — Ambulatory Visit: Payer: 59 | Admitting: Physician Assistant

## 2020-06-19 ENCOUNTER — Other Ambulatory Visit: Payer: Self-pay

## 2020-06-19 ENCOUNTER — Encounter: Payer: Self-pay | Admitting: Physician Assistant

## 2020-06-19 ENCOUNTER — Ambulatory Visit (INDEPENDENT_AMBULATORY_CARE_PROVIDER_SITE_OTHER): Payer: No Typology Code available for payment source | Admitting: Physician Assistant

## 2020-06-19 VITALS — BP 110/70 | HR 61 | Temp 98.3°F | Resp 16 | Ht 67.0 in | Wt 171.0 lb

## 2020-06-19 DIAGNOSIS — M898X1 Other specified disorders of bone, shoulder: Secondary | ICD-10-CM | POA: Diagnosis not present

## 2020-06-19 MED ORDER — METHYLPREDNISOLONE 4 MG PO TBPK: ORAL_TABLET | ORAL | 0 refills | Status: DC

## 2020-06-19 MED ORDER — KETOROLAC TROMETHAMINE 60 MG/2ML IM SOLN
60.0000 mg | Freq: Once | INTRAMUSCULAR | Status: AC
  Administered 2020-06-19: 60 mg via INTRAMUSCULAR

## 2020-06-19 NOTE — Progress Notes (Signed)
Patient presents to clinic today c/o several weeks of pain underneath R scapula with occasional radiation to superior shoulder. Denies any trauma or injury. Notes she woke up with pain once day. Has noted normal ROM of shoulder and distal R arm. Denies neck pain or pain of LUE. Was seen at Urgent Care at start of symptoms. Was given Solumedrol and Toradol injections. Notes initial improvement in pain but then returned to initial level of pain. Pain averages 4-5/10. Denies any numbness or tingling. Notes heat seems to help. Feels OTC tylenol and Ibuprofen do not help. .   Past Medical History:  Diagnosis Date  . Allergy   . Asthma   . Chicken pox   . Dermoid cyst    age 39; removed ovary, tube and appendix  . HPV (human papilloma virus) infection    Normal pap smears x 2 years.    Current Outpatient Medications on File Prior to Visit  Medication Sig Dispense Refill  . albuterol (PROVENTIL HFA;VENTOLIN HFA) 108 (90 Base) MCG/ACT inhaler Inhale 2 puffs into the lungs every 6 (six) hours as needed for wheezing or shortness of breath. 1 Inhaler 2  . Cholecalciferol (VITAMIN D-3) 5000 units TABS Take 1 tablet by mouth every other day.    . INTROVALE 0.15-0.03 MG tablet Take 1 tablet by mouth daily.  5  . cyclobenzaprine (FLEXERIL) 5 MG tablet Take 1-2 tablets (5-10 mg total) by mouth 2 (two) times daily as needed for muscle spasms. (Patient not taking: Reported on 06/19/2020) 30 tablet 0   No current facility-administered medications on file prior to visit.    Allergies  Allergen Reactions  . Amoxicillin Other (See Comments)    Double Vision    Family History  Problem Relation Age of Onset  . Sickle cell anemia Mother   . Arthritis Mother   . Cancer Maternal Grandmother        lymphoma  . Alzheimer's disease Maternal Grandmother   . Cancer Paternal Grandmother        uterine  . Diabetes Paternal Grandmother   . COPD Paternal Grandmother   . Uterine cancer Paternal Grandmother   .  Hypertension Paternal Grandmother   . Asthma Brother   . Asthma Brother   . Sickle cell trait Brother     Social History   Socioeconomic History  . Marital status: Single    Spouse name: Not on file  . Number of children: Not on file  . Years of education: Not on file  . Highest education level: Not on file  Occupational History  . Not on file  Tobacco Use  . Smoking status: Never Smoker  . Smokeless tobacco: Never Used  Vaping Use  . Vaping Use: Never used  Substance and Sexual Activity  . Alcohol use: Yes    Comment: drinks very rarely  . Drug use: No  . Sexual activity: Yes    Birth control/protection: Pill  Other Topics Concern  . Not on file  Social History Narrative  . Not on file   Social Determinants of Health   Financial Resource Strain:   . Difficulty of Paying Living Expenses: Not on file  Food Insecurity:   . Worried About Charity fundraiser in the Last Year: Not on file  . Ran Out of Food in the Last Year: Not on file  Transportation Needs:   . Lack of Transportation (Medical): Not on file  . Lack of Transportation (Non-Medical): Not on file  Physical Activity:   .  Days of Exercise per Week: Not on file  . Minutes of Exercise per Session: Not on file  Stress:   . Feeling of Stress : Not on file  Social Connections:   . Frequency of Communication with Friends and Family: Not on file  . Frequency of Social Gatherings with Friends and Family: Not on file  . Attends Religious Services: Not on file  . Active Member of Clubs or Organizations: Not on file  . Attends Archivist Meetings: Not on file  . Marital Status: Not on file   Review of Systems - See HPI.  All other ROS are negative.  BP 110/70   Pulse 61   Temp 98.3 F (36.8 C) (Temporal)   Resp 16   Ht 5\' 7"  (1.702 m)   Wt 171 lb (77.6 kg)   BMI 26.78 kg/m   Physical Exam Vitals reviewed.  Constitutional:      Appearance: Normal appearance.  HENT:     Head: Normocephalic  and atraumatic.  Cardiovascular:     Rate and Rhythm: Normal rate and regular rhythm.     Pulses: Normal pulses.     Heart sounds: Normal heart sounds.  Pulmonary:     Effort: Pulmonary effort is normal.  Musculoskeletal:     Right shoulder: No swelling, deformity, tenderness, bony tenderness or crepitus. Normal range of motion. Normal strength.     Left shoulder: Normal.     Right upper arm: Normal.     Cervical back: Neck supple. No torticollis. No pain with movement, spinous process tenderness or muscular tenderness.  Neurological:     General: No focal deficit present.     Mental Status: She is alert and oriented to person, place, and time.  Psychiatric:        Mood and Affect: Mood normal.      Assessment/Plan: 1. Pain of right scapula No history of trauma. No alarm signs/symptoms. Examination is relatively unremarkable. Normal ROM, strength, sensation. Unable to reproduce pain. Pain seems to be focal, underneath scapula. Will give Toradol injection today. Start Medrol dose pack. Continue heat to the area. Will proceed with PT evaluation and treatment. If not improving will need to proceed with imaging and consideration of Ortho assessment.  - Ambulatory referral to Physical Therapy - ketorolac (TORADOL) injection 60 mg  This visit occurred during the SARS-CoV-2 public health emergency.  Safety protocols were in place, including screening questions prior to the visit, additional usage of staff PPE, and extensive cleaning of exam room while observing appropriate contact time as indicated for disinfecting solutions.     Leeanne Rio, PA-C

## 2020-06-19 NOTE — Patient Instructions (Signed)
Please start the medrol dose pack given. Tylenol for breakthrough pain. Ok to continue muscle relaxant as directed. Can apply heat to the area for 10-15 minutes a few times per day.   I am getting you set up with physical therapy for further management to get this resolved for you.

## 2020-07-08 ENCOUNTER — Ambulatory Visit: Payer: No Typology Code available for payment source | Admitting: Physical Therapy

## 2020-07-22 ENCOUNTER — Ambulatory Visit: Payer: No Typology Code available for payment source | Admitting: Physical Therapy

## 2020-10-10 ENCOUNTER — Encounter: Payer: Self-pay | Admitting: Family Medicine

## 2020-10-22 DIAGNOSIS — J4520 Mild intermittent asthma, uncomplicated: Secondary | ICD-10-CM | POA: Insufficient documentation

## 2020-11-11 ENCOUNTER — Ambulatory Visit (INDEPENDENT_AMBULATORY_CARE_PROVIDER_SITE_OTHER): Payer: No Typology Code available for payment source | Admitting: Family Medicine

## 2020-11-11 ENCOUNTER — Encounter: Payer: Self-pay | Admitting: Family Medicine

## 2020-11-11 ENCOUNTER — Other Ambulatory Visit: Payer: Self-pay

## 2020-11-11 ENCOUNTER — Encounter: Payer: 59 | Admitting: Family Medicine

## 2020-11-11 VITALS — BP 118/78 | Temp 97.5°F | Resp 17 | Ht 66.0 in | Wt 186.0 lb

## 2020-11-11 DIAGNOSIS — E559 Vitamin D deficiency, unspecified: Secondary | ICD-10-CM | POA: Diagnosis not present

## 2020-11-11 DIAGNOSIS — Z0000 Encounter for general adult medical examination without abnormal findings: Secondary | ICD-10-CM | POA: Diagnosis not present

## 2020-11-11 DIAGNOSIS — E669 Obesity, unspecified: Secondary | ICD-10-CM

## 2020-11-11 NOTE — Assessment & Plan Note (Signed)
Pt has hx of Vit D deficiency.  Check labs and replete prn. 

## 2020-11-11 NOTE — Progress Notes (Signed)
   Subjective:    Patient ID: Kristen Floyd, female    DOB: December 04, 1980, 40 y.o.   MRN: [id number]  HPI CPE- UTD on pap, Tdap.  UTD on COVID.  Declines flu  Reviewed past medical, surgical, family and social histories.   Health Maintenance  Topic Date Due  . COVID-19 Vaccine (2 - Booster for Janssen series) 11/27/2020 (Originally 03/16/2020)  . INFLUENZA VACCINE  01/09/2021 (Originally 05/12/2020)  . PAP SMEAR-Modifier  09/21/2021  . TETANUS/TDAP  11/04/2026  . Hepatitis C Screening  Completed  . HIV Screening  Completed    Patient Care Team    Relationship Specialty Notifications Start End  Midge Minium, MD PCP - General Family Medicine  07/18/12   Sedalia Muta, MD Referring Physician Obstetrics and Gynecology  10/29/15       Review of Systems Patient reports no vision/ hearing changes, adenopathy,fever, persistant/recurrent hoarseness , swallowing issues, chest pain, palpitations, edema, persistant/recurrent cough, hemoptysis, dyspnea (rest/exertional/paroxysmal nocturnal), gastrointestinal bleeding (melena, rectal bleeding), abdominal pain, significant heartburn, bowel changes, GU symptoms (dysuria, hematuria, incontinence), Gyn symptoms (abnormal  bleeding, pain),  syncope, focal weakness, memory loss, numbness & tingling, skin/hair/nail changes, abnormal bruising or bleeding, anxiety, or depression.   + 15 lb weight gain  This visit occurred during the SARS-CoV-2 public health emergency.  Safety protocols were in place, including screening questions prior to the visit, additional usage of staff PPE, and extensive cleaning of exam room while observing appropriate contact time as indicated for disinfecting solutions.       Objective:   Physical Exam General Appearance:    Alert, cooperative, no distress, appears stated age  Head:    Normocephalic, without obvious abnormality, atraumatic  Eyes:    PERRL, conjunctiva/corneas clear, EOM's intact, fundi    benign, both eyes   Ears:    Normal TM's and external ear canals, both ears  Nose:   Deferred due to COVID  Throat:   Neck:   Supple, symmetrical, trachea midline, no adenopathy;    Thyroid: no enlargement/tenderness/nodules  Back:     Symmetric, no curvature, ROM normal, no CVA tenderness  Lungs:     Clear to auscultation bilaterally, respirations unlabored  Chest Wall:    No tenderness or deformity   Heart:    Regular rate and rhythm, S1 and S2 normal, no murmur, rub   or gallop  Breast Exam:    Deferred to GYN  Abdomen:     Soft, non-tender, bowel sounds active all four quadrants,    no masses, no organomegaly  Genitalia:    Deferred to GYN  Rectal:    Extremities:   Extremities normal, atraumatic, no cyanosis or edema  Pulses:   2+ and symmetric all extremities  Skin:   Skin color, texture, turgor normal, no rashes or lesions  Lymph nodes:   Cervical, supraclavicular, and axillary nodes normal  Neurologic:   CNII-XII intact, normal strength, sensation and reflexes    throughout          Assessment & Plan:

## 2020-11-11 NOTE — Assessment & Plan Note (Signed)
Pt's PE WNL w/ exception of weight gain.  UTD on pap, immunizations.  Check labs.  Anticipatory guidance provided.

## 2020-11-11 NOTE — Patient Instructions (Addendum)
Follow up in 1 year or as needed We'll notify you of your lab results and make any changes if needed Continue to work on healthy diet and regular exercise- you can do it! Call with any questions or concerns Stay Safe!  Stay Healthy! Save Your Sanity!!!

## 2020-11-12 LAB — HEPATIC FUNCTION PANEL
ALT: 11 U/L (ref 0–35)
AST: 13 U/L (ref 0–37)
Albumin: 4.5 g/dL (ref 3.5–5.2)
Alkaline Phosphatase: 60 U/L (ref 39–117)
Bilirubin, Direct: 0.1 mg/dL (ref 0.0–0.3)
Total Bilirubin: 0.4 mg/dL (ref 0.2–1.2)
Total Protein: 7.7 g/dL (ref 6.0–8.3)

## 2020-11-12 LAB — CBC WITH DIFFERENTIAL/PLATELET
Basophils Absolute: 0 10*3/uL (ref 0.0–0.1)
Basophils Relative: 0.4 % (ref 0.0–3.0)
Eosinophils Absolute: 0.2 10*3/uL (ref 0.0–0.7)
Eosinophils Relative: 2.3 % (ref 0.0–5.0)
HCT: 40.8 % (ref 36.0–46.0)
Hemoglobin: 13.7 g/dL (ref 12.0–15.0)
Lymphocytes Relative: 34.6 % (ref 12.0–46.0)
Lymphs Abs: 2.3 10*3/uL (ref 0.7–4.0)
MCHC: 33.5 g/dL (ref 30.0–36.0)
MCV: 79.3 fl (ref 78.0–100.0)
Monocytes Absolute: 0.6 10*3/uL (ref 0.1–1.0)
Monocytes Relative: 9.6 % (ref 3.0–12.0)
Neutro Abs: 3.5 10*3/uL (ref 1.4–7.7)
Neutrophils Relative %: 53.1 % (ref 43.0–77.0)
Platelets: 390 10*3/uL (ref 150.0–400.0)
RBC: 5.15 Mil/uL — ABNORMAL HIGH (ref 3.87–5.11)
RDW: 13.8 % (ref 11.5–15.5)
WBC: 6.6 10*3/uL (ref 4.0–10.5)

## 2020-11-12 LAB — LIPID PANEL
Cholesterol: 180 mg/dL (ref 0–200)
HDL: 64.6 mg/dL (ref >39.00)
LDL Cholesterol: 99 mg/dL (ref 0–99)
NonHDL: 115.47
Total CHOL/HDL Ratio: 3
Triglycerides: 84 mg/dL (ref 0.0–149.0)
VLDL: 16.8 mg/dL (ref 0.0–40.0)

## 2020-11-12 LAB — TSH: TSH: 2.45 u[IU]/mL (ref 0.35–4.50)

## 2020-11-12 LAB — BASIC METABOLIC PANEL
BUN: 11 mg/dL (ref 6–23)
CO2: 26 mEq/L (ref 19–32)
Calcium: 9.4 mg/dL (ref 8.4–10.5)
Chloride: 104 mEq/L (ref 96–112)
Creatinine, Ser: 0.82 mg/dL (ref 0.40–1.20)
GFR: 90.66 mL/min (ref >60.00)
Glucose, Bld: 77 mg/dL (ref 70–99)
Potassium: 3.9 mEq/L (ref 3.5–5.1)
Sodium: 138 mEq/L (ref 135–145)

## 2020-11-12 LAB — VITAMIN D 25 HYDROXY (VIT D DEFICIENCY, FRACTURES): VITD: 27.5 ng/mL — ABNORMAL LOW (ref 30.00–100.00)

## 2020-11-14 ENCOUNTER — Other Ambulatory Visit: Payer: Self-pay

## 2020-11-14 DIAGNOSIS — E559 Vitamin D deficiency, unspecified: Secondary | ICD-10-CM

## 2020-11-14 MED ORDER — VITAMIN D (ERGOCALCIFEROL) 1.25 MG (50000 UNIT) PO CAPS: 50000.0000 [IU] | ORAL_CAPSULE | ORAL | 0 refills | Status: DC

## 2020-12-13 ENCOUNTER — Encounter: Payer: Self-pay | Admitting: Family Medicine

## 2020-12-20 ENCOUNTER — Ambulatory Visit (INDEPENDENT_AMBULATORY_CARE_PROVIDER_SITE_OTHER): Payer: No Typology Code available for payment source | Admitting: Family Medicine

## 2020-12-20 ENCOUNTER — Other Ambulatory Visit: Payer: Self-pay

## 2020-12-20 ENCOUNTER — Encounter: Payer: Self-pay | Admitting: Family Medicine

## 2020-12-20 VITALS — BP 110/80 | Temp 97.7°F | Resp 17 | Ht 66.0 in | Wt 179.8 lb

## 2020-12-20 DIAGNOSIS — R030 Elevated blood-pressure reading, without diagnosis of hypertension: Secondary | ICD-10-CM | POA: Diagnosis not present

## 2020-12-20 DIAGNOSIS — J301 Allergic rhinitis due to pollen: Secondary | ICD-10-CM | POA: Insufficient documentation

## 2020-12-20 DIAGNOSIS — H1045 Other chronic allergic conjunctivitis: Secondary | ICD-10-CM | POA: Insufficient documentation

## 2020-12-20 DIAGNOSIS — J3081 Allergic rhinitis due to animal (cat) (dog) hair and dander: Secondary | ICD-10-CM | POA: Insufficient documentation

## 2020-12-20 NOTE — Patient Instructions (Signed)
Your BP is PERFECT today! No need for meds Spot check BP at home and if consistently higher than 135/90- let me know Keep up the good work on healthy diet, regular exercise Call with any questions or concerns Stay Safe!  Stay Healthy!

## 2020-12-20 NOTE — Progress Notes (Signed)
   Subjective:    Patient ID: Kristen Floyd, female    DOB: [birth year], 40 y.o.   MRN: [id number]  HPI Elevated BP- pt reports BP was elevated at GYN last week (149/98- Dynamap).  Reports it was 144/112 yesterday- she checked it b/c of a slight HA.  Has been checking BP at home w/ mom's automatic cuff.  At home, BPs have been 130s/100.  Reports home cuff is a large cuff- which is too big for her arm.  No CP, SOB, visual changes, edema.   Review of Systems For ROS see HPI   This visit occurred during the SARS-CoV-2 public health emergency.  Safety protocols were in place, including screening questions prior to the visit, additional usage of staff PPE, and extensive cleaning of exam room while observing appropriate contact time as indicated for disinfecting solutions.       Objective:   Physical Exam Vitals reviewed.  Constitutional:      Appearance: Normal appearance. She is not ill-appearing.  HENT:     Head: Normocephalic and atraumatic.  Cardiovascular:     Rate and Rhythm: Normal rate and regular rhythm.  Pulmonary:     Effort: Pulmonary effort is normal.     Breath sounds: Normal breath sounds.  Skin:    General: Skin is warm.  Neurological:     General: No focal deficit present.     Mental Status: She is alert and oriented to person, place, and time.  Psychiatric:        Mood and Affect: Mood normal.        Behavior: Behavior normal.        Thought Content: Thought content normal.           Assessment & Plan:  Elevated BP- pt's BP was elevated at GYN but she was nervous for the appt and they used a dynamap.  BP in office today is normal and consistent w/ what her readings have been back as far as 2013.  This is something to be mindful of given the family history but no need for meds at this time.  Will follow.

## 2021-01-07 ENCOUNTER — Other Ambulatory Visit: Payer: Self-pay

## 2021-01-07 ENCOUNTER — Encounter: Payer: Self-pay | Admitting: Family Medicine

## 2021-01-07 DIAGNOSIS — R200 Anesthesia of skin: Secondary | ICD-10-CM

## 2021-01-13 ENCOUNTER — Ambulatory Visit (INDEPENDENT_AMBULATORY_CARE_PROVIDER_SITE_OTHER): Payer: No Typology Code available for payment source | Admitting: Physical Therapy

## 2021-01-13 ENCOUNTER — Encounter: Payer: Self-pay | Admitting: Physical Therapy

## 2021-01-13 ENCOUNTER — Other Ambulatory Visit: Payer: Self-pay

## 2021-01-13 DIAGNOSIS — M5412 Radiculopathy, cervical region: Secondary | ICD-10-CM | POA: Diagnosis not present

## 2021-01-13 DIAGNOSIS — M62838 Other muscle spasm: Secondary | ICD-10-CM

## 2021-01-13 NOTE — Patient Instructions (Signed)
Access Code: [email or web address] URL: [email or web address] Date: 01/13/2021 Prepared by: Lyndee Hensen  Exercises Seated Scapular Retraction - 3 x daily - 1 sets - 10 reps Standing Backward Shoulder Rolls - 3 x daily - 1 sets - 10 reps Seated Cervical Sidebending Stretch - 2 x daily - 3 reps - 30 hold Seated Levator Scapulae Stretch - 2 x daily - 3 reps - 30 hold Standing Ulnar Nerve Glide - 2 x daily - 1 sets - 10 reps

## 2021-01-14 ENCOUNTER — Encounter: Payer: Self-pay | Admitting: Physical Therapy

## 2021-01-14 NOTE — Therapy (Signed)
[facility] [address] [place], [place], [address] Phone: [phone]   Fax:  [phone]  Physical Therapy Evaluation  Patient Details  Name: Kristen Floyd MRN: [id number] Date of Birth: January 13, 1981 Referring Provider (PT): Annye Asa   Encounter Date: 01/13/2021   PT End of Session - 01/14/21 0837    Visit Number 1    Number of Visits 12    Date for PT Re-Evaluation 02/24/21    Authorization Type [employer]    PT Start Time 1550    PT Stop Time 1630    PT Time Calculation (min) 40 min    Activity Tolerance Patient tolerated treatment well    Behavior During Therapy [facility] for tasks assessed/performed           Past Medical History:  Diagnosis Date  . Allergy   . Asthma   . Chicken pox   . Dermoid cyst    age 45; removed ovary, tube and appendix  . HPV (human papilloma virus) infection    Normal pap smears x 2 years.    Past Surgical History:  Procedure Laterality Date  . APPENDECTOMY     age 47  . DERMOID CYST  EXCISION  age 36   right tube and ovary removed  . RT salpingoopherectomy     age 49  . WISDOM TOOTH EXTRACTION  2007    There were no vitals filed for this visit.    Subjective Assessment - 01/14/21 0828    Subjective 4-5 fingers numb, started with crick in neck, med scap region, resolved in the past. Works from home, computer    Pain Onset More than a month ago              [facility] PT Assessment - 01/14/21 0001      Assessment   Medical Diagnosis cervical radiculopathy    Referring Provider (PT) Annye Asa    Hand Dominance Right    Prior Therapy no      Precautions   Precautions None      Balance Screen   Has the patient fallen in the past 6 months No      Prior Function   Level of Independence Independent      Cognition   Overall Cognitive Status Within Functional Limits for tasks assessed      Observation/Other Assessments   Other Surveys  Quick Dash      Functional  Tests   Functional tests Step up      ROM / Strength   AROM / PROM / Strength AROM;PROM;Strength      AROM   Overall AROM Comments Shoulder ROM: WNL    AROM Assessment Site Cervical;Shoulder    Right/Left Shoulder Right    Right Shoulder Flexion --   wnl   Cervical Flexion wnl    Cervical Extension wnl    Cervical - Right Side Bend wnl    Cervical - Left Side Bend wnl    Cervical - Right Rotation wnl    Cervical - Left Rotation wnl      Strength   Overall Strength Comments will test grip strength next visit      Palpation   Palpation comment Increased tenderness at R UT, levator and rhomboid      Special Tests    Special Tests Cervical    Other special tests Tinels: negative, No pain in elbow, wrist,    Cervical Tests Spurling's      Spurling's  Findings Negative                      Objective measurements completed on examination: See above findings.       [facility] PT Treatment/Exercise - 01/14/21 0001      Exercises   Exercises Neck      Neck Exercises: Standing   Other Standing Exercises Ulnar nerve glide x 10;      Neck Exercises: Seated   Shoulder Rolls 20 reps    Other Seated Exercise Scap retract x 20;      Neck Exercises: Stretches   Upper Trapezius Stretch 3 reps;30 seconds    Upper Trapezius Stretch Limitations bilateral    Levator Stretch 3 reps;30 seconds                  PT Education - 01/14/21 0836    Education Details PT POC, Exam findings,inital HEP    Person(s) Educated Patient    Methods Explanation;Demonstration;Tactile cues;Verbal cues;Handout    Comprehension Verbalized understanding;Returned demonstration;Verbal cues required;Need further instruction;Tactile cues required            PT Short Term Goals - 01/14/21 1121      PT SHORT TERM GOAL #1   Title Pt to be independent wtih initial HEP    Time 2    Period Weeks    Status New    Target Date 01/27/21             PT Long Term Goals -  01/14/21 1123      PT LONG TERM GOAL #1   Title Pt to report pain/sympotms in R UE to be 0/10 with work duties and activity    Time 6    Period Weeks    Status New    Target Date 02/24/21      PT LONG TERM GOAL #2   Title Pt to demo soft tissue limitations to be WNL in R shoulder/shoulder blade region.    Time 6    Period Weeks    Status New    Target Date 02/24/21      PT LONG TERM GOAL #3   Title Pt to be independent with final HEP    Time 6    Period Weeks    Status New    Target Date 02/24/21                  Plan - 01/13/21 1633    Clinical Impression Statement Pt presents with primary complaint of increased tingling into R ulnar nerve distribution. She has mild soreness in R shoulder blade region, with increased muscle tension in rhomboid, levator, and UT. She will benefit from education on posture for computer work from home. She has decreased ability for full functional activities with R UE and difficulty with work duties due to discomfort. Pt to benefit from skilled PT to improve deficits and radicular symptoms.    Examination-Activity Limitations Reach Overhead;Carry;Lift    Examination-Participation Restrictions Occupation;Community Activity   Simultaneous filing. User may not have seen previous data.   Stability/Clinical Decision Making Stable/Uncomplicated    Clinical Decision Making Low    Rehab Potential Good    PT Frequency 2x / week    PT Duration 6 weeks    PT Treatment/Interventions ADLs/Self Care Home Management;Cryotherapy;Electrical Stimulation;Iontophoresis 4mg /ml Dexamethasone;Moist Heat;Traction;Ultrasound;Functional mobility training;Therapeutic activities;Therapeutic exercise;Neuromuscular re-education;Manual techniques;Patient/family education;Passive range of motion;Dry needling;Taping;Joint Manipulations;Spinal Manipulations;Vasopneumatic Device    PT Home Exercise Plan [email or web address]  Consulted and Agree with Plan of Care Patient            Patient will benefit from skilled therapeutic intervention in order to improve the following deficits and impairments:  Increased muscle spasms,Postural dysfunction,Pain,Decreased activity tolerance (Simultaneous filing. User may not have seen previous data.)  Visit Diagnosis: Radiculopathy, cervical region  Other muscle spasm     Problem List Patient Active Problem List   Diagnosis Date Noted  . Allergic rhinitis due to animal (cat) (dog) hair and dander 12/20/2020  . Allergic rhinitis due to pollen 12/20/2020  . Chronic allergic conjunctivitis 12/20/2020  . Mild intermittent asthma 10/22/2020  . Obesity (BMI 30-39.9) 11/10/2019  . Vitamin D deficiency 11/05/2017  . Physical exam 10/29/2015  . Thyromegaly 10/29/2015  . Allergic rhinitis 07/18/2012    Lyndee Hensen, PT, DPT 11:41 AM  01/14/21    [facility] [place], [place], [address] Phone: [phone]   Fax:  [phone]  Name: GIANELLE MCCAUL MRN: [id number] Date of Birth: 1981/08/23

## 2021-01-20 ENCOUNTER — Ambulatory Visit (INDEPENDENT_AMBULATORY_CARE_PROVIDER_SITE_OTHER): Payer: No Typology Code available for payment source | Admitting: Physical Therapy

## 2021-01-20 ENCOUNTER — Encounter: Payer: Self-pay | Admitting: Physical Therapy

## 2021-01-20 DIAGNOSIS — M62838 Other muscle spasm: Secondary | ICD-10-CM

## 2021-01-20 DIAGNOSIS — M5412 Radiculopathy, cervical region: Secondary | ICD-10-CM

## 2021-01-20 NOTE — Therapy (Addendum)
[facility] [address] [place], [place], [address] Phone: [phone]   Fax:  [phone]  Physical Therapy Treatment  Patient Details  Name: QUANTINA DERSHEM MRN: [id number] Date of Birth: 10/05/1981 Referring Provider (PT): Annye Asa   Encounter Date: 01/20/2021   PT End of Session - 01/20/21 1636     Visit Number 2    Number of Visits 12    Date for PT Re-Evaluation 02/24/21    Authorization Type [employer]    PT Start Time 1555    PT Stop Time 1625    PT Time Calculation (min) 30 min    Activity Tolerance Patient tolerated treatment well    Behavior During Therapy [facility] for tasks assessed/performed             Past Medical History:  Diagnosis Date   Allergy    Asthma    Chicken pox    Dermoid cyst    age 40; removed ovary, tube and appendix   HPV (human papilloma virus) infection    Normal pap smears x 2 years.    Past Surgical History:  Procedure Laterality Date   APPENDECTOMY     age 40   DERMOID CYST  EXCISION  age 40   right tube and ovary removed   RT salpingoopherectomy     age 40   WISDOM TOOTH EXTRACTION  2007    There were no vitals filed for this visit.   Subjective Assessment - 01/20/21 1634     Subjective Pt states no pain or tingling since last visit. She has been doing HEP. She has also been more aware of her posture when working.    Currently in Pain? No/denies    Pain Score 0-No pain                [facility] PT Assessment - 01/20/21 0001       Strength   Overall Strength Comments Grip: L: 47.6,  R: 49.6                           [facility] Adult PT Treatment/Exercise - 01/20/21 0001       Exercises   Exercises Neck      Neck Exercises: Theraband   Rows 20 reps;Green    Shoulder External Rotation 15 reps    Shoulder External Rotation Limitations YTB      Neck Exercises: Standing   Other Standing Exercises Ulnar nerve glide x 10 bil;      Neck Exercises:  Seated   Shoulder Rolls 20 reps    Other Seated Exercise Scap retract x 10;      Manual Therapy   Manual Therapy Joint mobilization;Soft tissue mobilization;Passive ROM    Joint Mobilization Manual distraction cervical 10 sec x 8;    Passive ROM Manual stretching for UT and levator      Neck Exercises: Stretches   Upper Trapezius Stretch 3 reps;30 seconds    Upper Trapezius Stretch Limitations bilateral    Levator Stretch 3 reps;30 seconds                    PT Education - 01/20/21 1636     Education Details Discussed final HEP , discussed taking frequent posture breaks at work.    Person(s) Educated Patient    Methods Explanation;Demonstration;Tactile cues;Verbal cues;Handout    Comprehension Verbalized understanding;Returned demonstration;Verbal cues required;Tactile cues required;Need further instruction  PT Short Term Goals - 01/20/21 1637       PT SHORT TERM GOAL #1   Title Pt to be independent wtih initial HEP    Time 2    Period Weeks    Status Achieved    Target Date 01/27/21               PT Long Term Goals - 01/20/21 1637       PT LONG TERM GOAL #1   Title Pt to report pain/sympotms in R UE to be 0/10 with work duties and activity    Time 6    Period Weeks    Status Achieved      PT LONG TERM GOAL #2   Title Pt to demo soft tissue limitations to be WNL in R shoulder/shoulder blade region.    Time 6    Period Weeks    Status Achieved      PT LONG TERM GOAL #3   Title Pt to be independent with final HEP    Time 6    Period Weeks    Status Achieved                   Plan - 01/20/21 1638     Clinical Impression Statement Pt with no pain or tingling symptoms since last visit. She is doing well with HEP, and taking postural breaks from work. Discussed continued importance of this. Pt with no further pain or strength deficits that are effecting her mobility. She has met goals at this time. Pt will return within 2  weeks if any pain returns or pt has difficulty or questions with HEP. Otherwise, will d/c to HEP. Pt in agreement with plan.    Examination-Activity Limitations Reach Overhead;Carry;Lift    Examination-Participation Restrictions Occupation;Community Activity    Stability/Clinical Decision Making Stable/Uncomplicated    Rehab Potential Good    PT Frequency 2x / week    PT Duration 6 weeks    PT Treatment/Interventions ADLs/Self Care Home Management;Cryotherapy;Electrical Stimulation;Iontophoresis 4mg/ml Dexamethasone;Moist Heat;Traction;Ultrasound;Functional mobility training;Therapeutic activities;Therapeutic exercise;Neuromuscular re-education;Manual techniques;Patient/family education;Passive range of motion;Dry needling;Taping;Joint Manipulations;Spinal Manipulations;Vasopneumatic Device    PT Home Exercise Plan [id number]    Consulted and Agree with Plan of Care Patient             Patient will benefit from skilled therapeutic intervention in order to improve the following deficits and impairments:  Increased muscle spasms,Postural dysfunction,Pain,Decreased activity tolerance  Visit Diagnosis: Radiculopathy, cervical region  Other muscle spasm     Problem List Patient Active Problem List   Diagnosis Date Noted   Allergic rhinitis due to animal (cat) (dog) hair and dander 12/20/2020   Allergic rhinitis due to pollen 12/20/2020   Chronic allergic conjunctivitis 12/20/2020   Mild intermittent asthma 10/22/2020   Obesity (BMI 30-39.9) 11/10/2019   Vitamin D deficiency 11/05/2017   Physical exam 10/29/2015   Thyromegaly 10/29/2015   Allergic rhinitis 07/18/2012    Lauren Carroll, PT, DPT 4:40 PM  01/20/21    [facility] [address] [place], [place], [address] Phone: [phone]   Fax:  [phone]  Name: Trinika L Logan MRN: [id number] Date of Birth: 08/19/1981  PHYSICAL THERAPY DISCHARGE SUMMARY  Visits from Start of  Care: 2 Plan: Patient agrees to discharge.  Patient goals were not met. Patient is being discharged due to meeting the stated rehab goals.     Lauren Carroll, PT, DPT 12:12 PM  10/09/21      

## 2021-01-27 ENCOUNTER — Encounter: Payer: No Typology Code available for payment source | Admitting: Physical Therapy

## 2021-02-03 ENCOUNTER — Encounter: Payer: No Typology Code available for payment source | Admitting: Physical Therapy

## 2021-04-09 ENCOUNTER — Encounter: Payer: Self-pay | Admitting: *Deleted

## 2021-08-19 ENCOUNTER — Encounter: Payer: Self-pay | Admitting: Family Medicine

## 2021-08-19 ENCOUNTER — Encounter: Payer: Self-pay | Admitting: Registered Nurse

## 2021-08-19 ENCOUNTER — Other Ambulatory Visit: Payer: Self-pay

## 2021-08-19 ENCOUNTER — Telehealth (INDEPENDENT_AMBULATORY_CARE_PROVIDER_SITE_OTHER): Payer: No Typology Code available for payment source | Admitting: Registered Nurse

## 2021-08-19 DIAGNOSIS — B9689 Other specified bacterial agents as the cause of diseases classified elsewhere: Secondary | ICD-10-CM

## 2021-08-19 DIAGNOSIS — R051 Acute cough: Secondary | ICD-10-CM

## 2021-08-19 DIAGNOSIS — J988 Other specified respiratory disorders: Secondary | ICD-10-CM

## 2021-08-19 MED ORDER — AZITHROMYCIN 250 MG PO TABS: ORAL_TABLET | ORAL | 0 refills | Status: AC

## 2021-08-19 MED ORDER — BENZONATATE 100 MG PO CAPS: 100.0000 mg | ORAL_CAPSULE | Freq: Three times a day (TID) | ORAL | 0 refills | Status: DC | PRN

## 2021-08-19 MED ORDER — PREDNISONE 10 MG (21) PO TBPK: ORAL_TABLET | ORAL | 0 refills | Status: DC

## 2021-08-19 NOTE — Progress Notes (Signed)
Telemedicine Encounter- SOAP NOTE Established Patient  This video encounter was conducted with the patient's (or proxy's) verbal consent via audio telecommunications: yes/no: Yes Patient was instructed to have this encounter in a suitably private space; and to only have persons present to whom they give permission to participate. In addition, patient identity was confirmed by use of name plus two identifiers (DOB and address).  I discussed the limitations, risks, security and privacy concerns of performing an evaluation and management service by telephone and the availability of in person appointments. I also discussed with the patient that there may be a patient responsible charge related to this service. The patient expressed understanding and agreed to proceed.  I spent a total of 14 minutes talking with the patient or their proxy.  Patient at home Provider in office  Participants: Kathrin Ruddy, NP and [name]  Chief Complaint  Patient presents with   Cough    Patient states she has been experiencing a Cough, sore throat and congestion since last week. She has taken some OTC cough syrup and cough drops with some relief but not 100% better    Subjective   Kristen Floyd is a 40 y.o. established patient. video visit today for cough   HPI Onset 08/13/21 Sore throat, coughing, congestion Clear mucus Had been taking OTC cough suppressant and cold relief. Deteriorated - Thursday took the day off from work to rest.  Thankfully, improved since, only has cough and congestion remaining. Today noted yellow sputum.  No major shob, does note her asthma is worse when she's sick but no wheezing  No fevers, chills, fatigue. Denies nvd - occasional vomiting when coughing a lot but none related to nausea.  No chest pain.  2x covid home tests, both negative   Patient Active Problem List   Diagnosis Date Noted   Allergic rhinitis due to animal (cat) (dog) hair and dander 12/20/2020    Allergic rhinitis due to pollen 12/20/2020   Chronic allergic conjunctivitis 12/20/2020   Mild intermittent asthma 10/22/2020   Obesity (BMI 30-39.9) 11/10/2019   Vitamin D deficiency 11/05/2017   Physical exam 10/29/2015   Thyromegaly 10/29/2015   Allergic rhinitis 07/18/2012    Past Medical History:  Diagnosis Date   Allergy    Asthma    Chicken pox    Dermoid cyst    age 75; removed ovary, tube and appendix   HPV (human papilloma virus) infection    Normal pap smears x 2 years.    Current Outpatient Medications  Medication Sig Dispense Refill   albuterol (PROVENTIL HFA;VENTOLIN HFA) 108 (90 Base) MCG/ACT inhaler Inhale 2 puffs into the lungs every 6 (six) hours as needed for wheezing or shortness of breath. 1 Inhaler 2   azithromycin (ZITHROMAX) 250 MG tablet Take 2 tablets on day 1, then 1 tablet daily on days 2 through 5 6 tablet 0   benzonatate (TESSALON) 100 MG capsule Take 1 capsule (100 mg total) by mouth 3 (three) times daily as needed for cough. 20 capsule 0   Cholecalciferol (VITAMIN D-3) 5000 units TABS Take 1 tablet by mouth every other day.     EPINEPHrine 0.3 mg/0.3 mL IJ SOAJ injection See admin instructions.     INTROVALE 0.15-0.03 MG tablet Take 1 tablet by mouth daily.  5   predniSONE (STERAPRED UNI-PAK 21 TAB) 10 MG (21) TBPK tablet Take per package instructions. Do not skip doses. Finish entire supply. 1 each 0   cyclobenzaprine (FLEXERIL) 5  MG tablet Take 1-2 tablets (5-10 mg total) by mouth 2 (two) times daily as needed for muscle spasms. (Patient not taking: No sig reported) 30 tablet 0   methylPREDNISolone (MEDROL DOSEPAK) 4 MG TBPK tablet Take following package directions. 21 tablet 0   Vitamin D, Ergocalciferol, (DRISDOL) 1.25 MG (50000 UNIT) CAPS capsule Take 1 capsule (50,000 Units total) by mouth every 7 (seven) days. 12 capsule 0   No current facility-administered medications for this visit.    Allergies  Allergen Reactions   Amoxicillin Other  (See Comments)    Double Vision    Social History   Socioeconomic History   Marital status: Single    Spouse name: Not on file   Number of children: Not on file   Years of education: Not on file   Highest education level: Not on file  Occupational History   Not on file  Tobacco Use   Smoking status: Never   Smokeless tobacco: Never  Vaping Use   Vaping Use: Never used  Substance and Sexual Activity   Alcohol use: Yes    Comment: drinks very rarely   Drug use: No   Sexual activity: Yes    Birth control/protection: Pill  Other Topics Concern   Not on file  Social History Narrative   Not on file   Social Determinants of Health   Financial Resource Strain: Not on file  Food Insecurity: Not on file  Transportation Needs: Not on file  Physical Activity: Not on file  Stress: Not on file  Social Connections: Not on file  Intimate Partner Violence: Not on file    Review of Systems  Constitutional: Negative.   HENT:  Positive for congestion.   Eyes: Negative.   Respiratory:  Positive for cough, sputum production, shortness of breath and wheezing.   Cardiovascular: Negative.   Gastrointestinal: Negative.   Genitourinary: Negative.   Musculoskeletal: Negative.   Skin: Negative.   Neurological: Negative.   Endo/Heme/Allergies: Negative.   Psychiatric/Behavioral: Negative.    All other systems reviewed and are negative.  Objective   Vitals as reported by the patient: There were no vitals filed for this visit.  Kristen Floyd was seen today for cough.  Diagnoses and all orders for this visit:  Bacterial respiratory infection -     azithromycin (ZITHROMAX) 250 MG tablet; Take 2 tablets on day 1, then 1 tablet daily on days 2 through 5 -     predniSONE (STERAPRED UNI-PAK 21 TAB) 10 MG (21) TBPK tablet; Take per package instructions. Do not skip doses. Finish entire supply.  Acute cough -     benzonatate (TESSALON) 100 MG capsule; Take 1 capsule (100 mg total) by mouth 3  (three) times daily as needed for cough.   PLAN Worsening congestion, hx of asthma, and  > 1 week symptoms. Tx with z pack and prednisone as above. Return precautions reviewed. Tessalon for cough relief. Patient encouraged to call clinic with any questions, comments, or concerns.  I discussed the assessment and treatment plan with the patient. The patient was provided an opportunity to ask questions and all were answered. The patient agreed with the plan and demonstrated an understanding of the instructions.   The patient was advised to call back or seek an in-person evaluation if the symptoms worsen or if the condition fails to improve as anticipated.  I provided 14 minutes of face-to-face time during this encounter.  Maximiano Coss, NP

## 2021-11-04 ENCOUNTER — Encounter: Payer: Self-pay | Admitting: Family Medicine

## 2021-11-04 ENCOUNTER — Ambulatory Visit
Admission: EM | Admit: 2021-11-04 | Discharge: 2021-11-04 | Disposition: A | Discharge status: Home / Self Care | Payer: No Typology Code available for payment source | Attending: Emergency Medicine | Admitting: Emergency Medicine

## 2021-11-04 ENCOUNTER — Other Ambulatory Visit: Payer: Self-pay

## 2021-11-04 DIAGNOSIS — H66003 Acute suppurative otitis media without spontaneous rupture of ear drum, bilateral: Secondary | ICD-10-CM

## 2021-11-04 DIAGNOSIS — J208 Acute bronchitis due to other specified organisms: Secondary | ICD-10-CM

## 2021-11-04 DIAGNOSIS — B9689 Other specified bacterial agents as the cause of diseases classified elsewhere: Secondary | ICD-10-CM

## 2021-11-04 MED ORDER — GUAIFENESIN 400 MG PO TABS: ORAL_TABLET | ORAL | 0 refills | Status: DC

## 2021-11-04 MED ORDER — CEFDINIR 300 MG PO CAPS: 300.0000 mg | ORAL_CAPSULE | Freq: Two times a day (BID) | ORAL | 0 refills | Status: DC

## 2021-11-04 MED ORDER — IBUPROFEN 400 MG PO TABS: 400.0000 mg | ORAL_TABLET | Freq: Three times a day (TID) | ORAL | 0 refills | Status: DC | PRN

## 2021-11-04 NOTE — Discharge Instructions (Signed)
Patient history that he provided to me today along my physical exam findings, I believe that you are suffering from acute bacterial bronchitis along with acute infective otitis media in both ears.  I recommend that you begin taking cefdinir, a cephalosporin antibiotic, 300 mg twice daily for the next 10 days.  Is important that you take all capsules as prescribed, do not skip doses.  Incomplete antibiotic therapy can result in worsening infection that may require hospitalization.  I do not believe that you require treatment with oral steroids at this point, I am very glad that you came in when you did.  Please see the list below for recommended medications, dosages and frequencies to provide relief of your current symptoms:     Albuterol HFA: This is a bronchodilator, it relaxes the smooth muscles that constrict your airway in your lungs when you are feeling sick or having inflammation secondary to allergies or upper respiratory infection.  Please inhale 2 puffs twice daily every day using the spacer provided.  You can also inhale 2 more puffs as often as needed throughout the day for aggravating cough, chest tightness, feeling short of breath, wheezing.      Ibuprofen  (Advil, Motrin): This is a good anti-inflammatory medication which addresses aches, pains and inflammation of the upper airways that causes sinus and nasal congestion as well as in the lower airways which makes your cough feel tight and sometimes burn.  I recommend that you take between 400 to 600 mg every 6-8 hours as needed, I have provided you with a prescription for 400 mg.      Guaifenesin (Robitussin, Mucinex): This is an expectorant.  This helps break up chest congestion and loosen up thick nasal drainage making phlegm and drainage more liquid and therefore easier to remove.  I recommend being 400 mg three times daily as needed.   I provided you with a prescription for this as well.  Please be sure to follow-up in the next 3 to 5  days with either your primary care provider or with urgent care if your symptoms have not significantly improved with this treatment regimen or you begin to develop new symptoms.  Thank you for visiting urgent care today.  I appreciate the opportunity to participate in your care.

## 2021-11-04 NOTE — ED Provider Notes (Signed)
[facility]    CSN: [id number] Arrival date & time: 11/04/21  1304    HISTORY   Chief Complaint  Patient presents with   Cough    Congestion x 7 days.   HPI Kristen Floyd is a 41 y.o. female. Patient presents to urgent care today complaining of cough and congestion for the past 7 days.  Patient reports a history of allergies and exercise-induced asthma, states she currently uses albuterol and sees an allergist for desensitization therapy.  Patient states initially her sinus drainage and sputum from coughing has been clear however she noticed a little bit of brown and dark yellow sputum this morning when she coughed so she decided to have this evaluated.  Patient denies fever, aches, chills, nausea, vomiting, diarrhea, shortness of breath, hemoptysis.    The history is provided by the patient.  Past Medical History:  Diagnosis Date   Allergy    Asthma    Chicken pox    Dermoid cyst    age 72; removed ovary, tube and appendix   HPV (human papilloma virus) infection    Normal pap smears x 2 years.   Patient Active Problem List   Diagnosis Date Noted   Allergic rhinitis due to animal (cat) (dog) hair and dander 12/20/2020   Allergic rhinitis due to pollen 12/20/2020   Chronic allergic conjunctivitis 12/20/2020   Mild intermittent asthma 10/22/2020   Obesity (BMI 30-39.9) 11/10/2019   Vitamin D deficiency 11/05/2017   Physical exam 10/29/2015   Thyromegaly 10/29/2015   Allergic rhinitis 07/18/2012   Past Surgical History:  Procedure Laterality Date   APPENDECTOMY     age 32   DERMOID CYST  EXCISION  age 58   right tube and ovary removed   RT salpingoopherectomy     age 60   WISDOM TOOTH EXTRACTION  2007   OB History     Gravida  0   Para  0   Term      Preterm      AB      Living         SAB      IAB      Ectopic      Multiple      Live Births             Home Medications    Prior to Admission medications   Medication Sig  Start Date End Date Taking? Authorizing Provider  albuterol (PROVENTIL HFA;VENTOLIN HFA) 108 (90 Base) MCG/ACT inhaler Inhale 2 puffs into the lungs every 6 (six) hours as needed for wheezing or shortness of breath. 11/05/17   Midge Minium, MD  benzonatate (TESSALON) 100 MG capsule Take 1 capsule (100 mg total) by mouth 3 (three) times daily as needed for cough. 08/19/21   Maximiano Coss, NP  Cholecalciferol (VITAMIN D-3) 5000 units TABS Take 1 tablet by mouth every other day.    [provider]  cyclobenzaprine (FLEXERIL) 5 MG tablet Take 1-2 tablets (5-10 mg total) by mouth 2 (two) times daily as needed for muscle spasms. Patient not taking: No sig reported 06/10/20   Noe Gens, PA-C  EPINEPHrine 0.3 mg/0.3 mL IJ SOAJ injection See admin instructions.    [provider]  INTROVALE 0.15-0.03 MG tablet Take 1 tablet by mouth daily. 08/26/17   [provider]  methylPREDNISolone (MEDROL DOSEPAK) 4 MG TBPK tablet Take following package directions. 06/19/20   Brunetta Jeans, PA-C  predniSONE (STERAPRED UNI-PAK 21  TAB) 10 MG (21) TBPK tablet Take per package instructions. Do not skip doses. Finish entire supply. 08/19/21   Maximiano Coss, NP  Vitamin D, Ergocalciferol, (DRISDOL) 1.25 MG (50000 UNIT) CAPS capsule Take 1 capsule (50,000 Units total) by mouth every 7 (seven) days. 11/14/20   Midge Minium, MD   Family History Family History  Problem Relation Age of Onset   Sickle cell anemia Mother    Arthritis Mother    Cancer Maternal Grandmother        lymphoma   Alzheimer's disease Maternal Grandmother    Cancer Paternal Grandmother        uterine   Diabetes Paternal Grandmother    COPD Paternal Grandmother    Uterine cancer Paternal Grandmother    Hypertension Paternal Grandmother    Asthma Brother    Asthma Brother    Sickle cell trait Brother    Social History Social History   Tobacco Use   Smoking status: Never   Smokeless tobacco: Never   Vaping Use   Vaping Use: Never used  Substance Use Topics   Alcohol use: Yes    Comment: drinks very rarely   Drug use: No   Allergies   Amoxicillin  Review of Systems Review of Systems Pertinent findings noted in history of present illness.   Physical Exam Triage Vital Signs ED Triage Vitals  Enc Vitals Group     BP 08/08/21 0827 (!) 147/82     Pulse Rate 08/08/21 0827 72     Resp 08/08/21 0827 18     Temp 08/08/21 0827 98.3 F (36.8 C)     Temp Source 08/08/21 0827 Oral     SpO2 08/08/21 0827 98 %     Weight --      Height --      Head Circumference --      Peak Flow --      Pain Score 08/08/21 0826 5     Pain Loc --      Pain Edu? --      Excl. in [place]? --   No data found.  Updated Vital Signs BP (!) 150/97 (BP Location: Left Arm)    Pulse 76    Temp 98.7 F (37.1 C) (Oral)    Resp 16    SpO2 99%   Physical Exam Vitals and nursing note reviewed.  Constitutional:      General: She is not in acute distress.    Appearance: Normal appearance. She is not ill-appearing.  HENT:     Head: Normocephalic and atraumatic.     Salivary Glands: Right salivary gland is not diffusely enlarged or tender. Left salivary gland is not diffusely enlarged or tender.     Right Ear: Hearing, ear canal and external ear normal. No drainage. A middle ear effusion (Suppurative) is present. There is no impacted cerumen. Tympanic membrane is injected and bulging. Tympanic membrane is not erythematous.     Left Ear: Hearing, ear canal and external ear normal. No drainage. A middle ear effusion (Suppurative) is present. There is no impacted cerumen. Tympanic membrane is injected and bulging. Tympanic membrane is not erythematous.     Nose: Nose normal. No nasal deformity, septal deviation, mucosal edema, congestion or rhinorrhea.     Right Turbinates: Not enlarged, swollen or pale.     Left Turbinates: Not enlarged, swollen or pale.     Right Sinus: No maxillary sinus tenderness or frontal  sinus tenderness.     Left Sinus: No  maxillary sinus tenderness or frontal sinus tenderness.     Mouth/Throat:     Lips: Pink. No lesions.     Mouth: Mucous membranes are moist. No oral lesions.     Pharynx: Oropharynx is clear. Uvula midline. No posterior oropharyngeal erythema or uvula swelling.     Tonsils: No tonsillar exudate. 0 on the right. 0 on the left.  Eyes:     General: Lids are normal.        Right eye: No discharge.        Left eye: No discharge.     Extraocular Movements: Extraocular movements intact.     Conjunctiva/sclera: Conjunctivae normal.     Right eye: Right conjunctiva is not injected.     Left eye: Left conjunctiva is not injected.  Neck:     Trachea: Trachea and phonation normal.  Cardiovascular:     Rate and Rhythm: Normal rate and regular rhythm.     Pulses: Normal pulses.     Heart sounds: Normal heart sounds. No murmur heard.   No friction rub. No gallop.  Pulmonary:     Effort: Pulmonary effort is normal. No accessory muscle usage, prolonged expiration or respiratory distress.     Breath sounds: Normal breath sounds. No stridor, decreased air movement or transmitted upper airway sounds. No decreased breath sounds, wheezing, rhonchi or rales.     Comments: Turbulent breath sounds throughout Chest:     Chest wall: No tenderness.  Musculoskeletal:        General: Normal range of motion.     Cervical back: Normal range of motion and neck supple. Normal range of motion.  Lymphadenopathy:     Cervical: No cervical adenopathy.  Skin:    General: Skin is warm and dry.     Findings: No erythema or rash.  Neurological:     General: No focal deficit present.     Mental Status: She is alert and oriented to person, place, and time.  Psychiatric:        Mood and Affect: Mood normal.        Behavior: Behavior normal.    Visual Acuity Right Eye Distance:   Left Eye Distance:   Bilateral Distance:    Right Eye Near:   Left Eye Near:    Bilateral  Near:     UC Couse / Diagnostics / Procedures:    EKG  Radiology No results found.  Procedures Procedures (including critical care time)  UC Diagnoses / Final Clinical Impressions(s)   I have reviewed the triage vital signs and the nursing notes.  Pertinent labs & imaging results that were available during my care of the patient were reviewed by me and considered in my medical decision making (see chart for details).   Final diagnoses:  Non-recurrent acute suppurative otitis media of both ears without spontaneous rupture of tympanic membranes  Acute bacterial bronchitis   Patient advised to continue albuterol, have added Mucinex.  Patient prescribed cefdinir for bacterial infection in both ears, this should also provide her with coverage for any possible bacterial presence with her bronchitis.  Patient also provided with ibuprofen for nonsteroidal anti-inflammatory treatment of respiratory tissue.  Return precautions advised.  ED Prescriptions     Medication Sig Dispense Auth. Provider   cefdinir (OMNICEF) 300 MG capsule Take 1 capsule (300 mg total) by mouth 2 (two) times daily for 10 days. 20 capsule Lynden Oxford Scales, PA-C   ibuprofen (ADVIL) 400 MG tablet Take 1 tablet (400 mg  total) by mouth every 8 (eight) hours as needed for up to 30 doses. 30 tablet Lynden Oxford Scales, PA-C   guaifenesin (HUMIBID E) 400 MG TABS tablet Take 1 tablet 3 times daily as needed for chest congestion and cough 21 tablet Lynden Oxford Scales, PA-C      PDMP not reviewed this encounter.  Pending results:  Labs Reviewed - No data to display  Medications Ordered in UC: Medications - No data to display  Disposition Upon Discharge:  Condition: stable for discharge home Home: take medications as prescribed; routine discharge instructions as discussed; follow up as advised.  Patient presented with an acute illness with associated systemic symptoms and significant discomfort requiring  urgent management. In my opinion, this is a condition that a prudent lay person (someone who possesses an average knowledge of health and medicine) may potentially expect to result in complications if not addressed urgently such as respiratory distress, impairment of bodily function or dysfunction of bodily organs.   Routine symptom specific, illness specific and/or disease specific instructions were discussed with the patient and/or caregiver at length.   As such, the patient has been evaluated and assessed, work-up was performed and treatment was provided in alignment with urgent care protocols and evidence based medicine.  Patient/parent/caregiver has been advised that the patient may require follow up for further testing and treatment if the symptoms continue in spite of treatment, as clinically indicated and appropriate.  If the patient was tested for COVID-19, Influenza and/or RSV, then the patient/parent/guardian was advised to isolate at home pending the results of his/her diagnostic coronavirus test and potentially longer if theyre positive. I have also advised pt that if his/her COVID-19 test returns positive, it's recommended to self-isolate for at least 10 days after symptoms first appeared AND until fever-free for 24 hours without fever reducer AND other symptoms have improved or resolved. Discussed self-isolation recommendations as well as instructions for household member/close contacts as per the [facility] and [place] DHHS, and also gave patient the [place] packet with this information.  Patient/parent/caregiver has been advised to return to the [facility] or PCP in 3-5 days if no better; to PCP or the Emergency Department if new signs and symptoms develop, or if the current signs or symptoms continue to change or worsen for further workup, evaluation and treatment as clinically indicated and appropriate  The patient will follow up with their current PCP if and as advised. If the patient does not currently  have a PCP we will assist them in obtaining one.   The patient may need specialty follow up if the symptoms continue, in spite of conservative treatment and management, for further workup, evaluation, consultation and treatment as clinically indicated and appropriate.  Patient/parent/caregiver verbalized understanding and agreement of plan as discussed.  All questions were addressed during visit.  Please see discharge instructions below for further details of plan.  Discharge Instructions:   Discharge Instructions      Patient history that he provided to me today along my physical exam findings, I believe that you are suffering from acute bacterial bronchitis along with acute infective otitis media in both ears.  I recommend that you begin taking cefdinir, a cephalosporin antibiotic, 300 mg twice daily for the next 10 days.  Is important that you take all capsules as prescribed, do not skip doses.  Incomplete antibiotic therapy can result in worsening infection that may require hospitalization.  I do not believe that you require treatment with oral steroids at this point, I  am very glad that you came in when you did.  Please see the list below for recommended medications, dosages and frequencies to provide relief of your current symptoms:     Albuterol HFA: This is a bronchodilator, it relaxes the smooth muscles that constrict your airway in your lungs when you are feeling sick or having inflammation secondary to allergies or upper respiratory infection.  Please inhale 2 puffs twice daily every day using the spacer provided.  You can also inhale 2 more puffs as often as needed throughout the day for aggravating cough, chest tightness, feeling short of breath, wheezing.      Ibuprofen  (Advil, Motrin): This is a good anti-inflammatory medication which addresses aches, pains and inflammation of the upper airways that causes sinus and nasal congestion as well as in the lower airways which makes your  cough feel tight and sometimes burn.  I recommend that you take between 400 to 600 mg every 6-8 hours as needed, I have provided you with a prescription for 400 mg.      Guaifenesin (Robitussin, Mucinex): This is an expectorant.  This helps break up chest congestion and loosen up thick nasal drainage making phlegm and drainage more liquid and therefore easier to remove.  I recommend being 400 mg three times daily as needed.   I provided you with a prescription for this as well.  Please be sure to follow-up in the next 3 to 5 days with either your primary care provider or with urgent care if your symptoms have not significantly improved with this treatment regimen or you begin to develop new symptoms.  Thank you for visiting urgent care today.  I appreciate the opportunity to participate in your care.       This office note has been dictated using Museum/gallery curator.  Unfortunately, and despite my best efforts, this method of dictation can sometimes lead to occasional typographical or grammatical errors.  I apologize in advance if this occurs.     Lynden Oxford Scales, PA-C 11/04/21 1426

## 2021-11-04 NOTE — ED Triage Notes (Signed)
Pt presents to the office today for cough and congestion x 7 days.

## 2021-11-06 NOTE — Telephone Encounter (Signed)
FYI: Pt went to urgent care and was put on abx

## 2021-11-12 ENCOUNTER — Ambulatory Visit (INDEPENDENT_AMBULATORY_CARE_PROVIDER_SITE_OTHER): Payer: No Typology Code available for payment source | Admitting: Family Medicine

## 2021-11-12 ENCOUNTER — Encounter: Payer: Self-pay | Admitting: Family Medicine

## 2021-11-12 VITALS — BP 124/82 | HR 93 | Temp 98.1°F | Resp 16 | Ht 66.5 in | Wt 191.0 lb

## 2021-11-12 DIAGNOSIS — Z0000 Encounter for general adult medical examination without abnormal findings: Secondary | ICD-10-CM | POA: Diagnosis not present

## 2021-11-12 DIAGNOSIS — L910 Hypertrophic scar: Secondary | ICD-10-CM

## 2021-11-12 DIAGNOSIS — E559 Vitamin D deficiency, unspecified: Secondary | ICD-10-CM

## 2021-11-12 DIAGNOSIS — L720 Epidermal cyst: Secondary | ICD-10-CM | POA: Diagnosis not present

## 2021-11-12 DIAGNOSIS — E669 Obesity, unspecified: Secondary | ICD-10-CM | POA: Diagnosis not present

## 2021-11-12 LAB — BASIC METABOLIC PANEL
BUN: 11 mg/dL (ref 6–23)
CO2: 28 mEq/L (ref 19–32)
Calcium: 9.4 mg/dL (ref 8.4–10.5)
Chloride: 107 mEq/L (ref 96–112)
Creatinine, Ser: 0.84 mg/dL (ref 0.40–1.20)
GFR: 87.46 mL/min (ref >60.00)
Glucose, Bld: 78 mg/dL (ref 70–99)
Potassium: 4 mEq/L (ref 3.5–5.1)
Sodium: 141 mEq/L (ref 135–145)

## 2021-11-12 LAB — VITAMIN D 25 HYDROXY (VIT D DEFICIENCY, FRACTURES): VITD: 20.76 ng/mL — ABNORMAL LOW (ref 30.00–100.00)

## 2021-11-12 LAB — CBC WITH DIFFERENTIAL/PLATELET
Basophils Absolute: 0 10*3/uL (ref 0.0–0.1)
Basophils Relative: 0.3 % (ref 0.0–3.0)
Eosinophils Absolute: 0.2 10*3/uL (ref 0.0–0.7)
Eosinophils Relative: 2.6 % (ref 0.0–5.0)
HCT: 38 % (ref 36.0–46.0)
Hemoglobin: 12.7 g/dL (ref 12.0–15.0)
Lymphocytes Relative: 41.5 % (ref 12.0–46.0)
Lymphs Abs: 2.4 10*3/uL (ref 0.7–4.0)
MCHC: 33.4 g/dL (ref 30.0–36.0)
MCV: 78.1 fl (ref 78.0–100.0)
Monocytes Absolute: 0.6 10*3/uL (ref 0.1–1.0)
Monocytes Relative: 11 % (ref 3.0–12.0)
Neutro Abs: 2.6 10*3/uL (ref 1.4–7.7)
Neutrophils Relative %: 44.6 % (ref 43.0–77.0)
Platelets: 384 10*3/uL (ref 150.0–400.0)
RBC: 4.86 Mil/uL (ref 3.87–5.11)
RDW: 13.3 % (ref 11.5–15.5)
WBC: 5.8 10*3/uL (ref 4.0–10.5)

## 2021-11-12 LAB — TSH: TSH: 0.94 u[IU]/mL (ref 0.35–5.50)

## 2021-11-12 LAB — HEPATIC FUNCTION PANEL
ALT: 7 U/L (ref 0–35)
AST: 9 U/L (ref 0–37)
Albumin: 4.3 g/dL (ref 3.5–5.2)
Alkaline Phosphatase: 58 U/L (ref 39–117)
Bilirubin, Direct: 0.1 mg/dL (ref 0.0–0.3)
Total Bilirubin: 0.5 mg/dL (ref 0.2–1.2)
Total Protein: 6.9 g/dL (ref 6.0–8.3)

## 2021-11-12 LAB — LIPID PANEL
Cholesterol: 143 mg/dL (ref 0–200)
HDL: 55.8 mg/dL (ref >39.00)
LDL Cholesterol: 73 mg/dL (ref 0–99)
NonHDL: 86.96
Total CHOL/HDL Ratio: 3
Triglycerides: 72 mg/dL (ref 0.0–149.0)
VLDL: 14.4 mg/dL (ref 0.0–40.0)

## 2021-11-12 NOTE — Assessment & Plan Note (Signed)
Pt's PE WNL w/ exception of BMI and known thyromegaly.  UTD Tdap, pap.  Check labs.  Anticipatory guidance provided.

## 2021-11-12 NOTE — Progress Notes (Signed)
° °  Subjective:    Patient ID: Kristen Floyd, female    DOB: October 21, 1980, 41 y.o.   MRN: [id number]  HPI CPE- UTD on Tdap.  UTD on pap.  Patient Care Team    Relationship Specialty Notifications Start End  Midge Minium, MD PCP - General Family Medicine  07/18/12   Sedalia Muta, MD Referring Physician Obstetrics and Gynecology  10/29/15     Health Maintenance  Topic Date Due   COVID-19 Vaccine (4 - Booster for Janssen series) 01/25/2021   PAP SMEAR-Modifier  09/21/2021   INFLUENZA VACCINE  01/09/2022 (Originally 05/12/2021)   TETANUS/TDAP  11/04/2026   Hepatitis C Screening  Completed   HIV Screening  Completed   HPV VACCINES  Aged Out      Review of Systems Patient reports no vision/ hearing changes, adenopathy,fever, persistant/recurrent hoarseness , swallowing issues, chest pain, palpitations, edema, persistant/recurrent cough, hemoptysis, dyspnea (rest/exertional/paroxysmal nocturnal), gastrointestinal bleeding (melena, rectal bleeding), abdominal pain, significant heartburn, bowel changes, GU symptoms (dysuria, hematuria, incontinence), Gyn symptoms (abnormal  bleeding, pain),  syncope, focal weakness, memory loss, numbness & tingling, skin/hair/nail changes, abnormal bruising or bleeding, anxiety, or depression.   + 11 lb weight gain  This visit occurred during the SARS-CoV-2 public health emergency.  Safety protocols were in place, including screening questions prior to the visit, additional usage of staff PPE, and extensive cleaning of exam room while observing appropriate contact time as indicated for disinfecting solutions.      Objective:   Physical Exam General Appearance:    Alert, cooperative, no distress, appears stated age  Head:    Normocephalic, without obvious abnormality, atraumatic  Eyes:    PERRL, conjunctiva/corneas clear, EOM's intact, fundi    benign, both eyes  Ears:    Normal TM's and external ear canals, both ears  Nose:   Deferred due to COVID   Throat:   Neck:   Supple, symmetrical, trachea midline, no adenopathy;    Thyroid: diffuse enlargement  Back:     Symmetric, no curvature, ROM normal, no CVA tenderness  Lungs:     Clear to auscultation bilaterally, respirations unlabored  Chest Wall:    No tenderness or deformity   Heart:    Regular rate and rhythm, S1 and S2 normal, no murmur, rub   or gallop  Breast Exam:    Deferred to GYN  Abdomen:     Soft, non-tender, bowel sounds active all four quadrants,    no masses, no organomegaly  Genitalia:    Deferred to GYN  Rectal:    Extremities:   Extremities normal, atraumatic, no cyanosis or edema  Pulses:   2+ and symmetric all extremities  Skin:   Skin color, texture, turgor normal, no rashes or lesions.  Small cyst on chest wall.  Keloids on L upper arm  Lymph nodes:   Cervical, supraclavicular, and axillary nodes normal  Neurologic:   CNII-XII intact, normal strength, sensation and reflexes    throughout          Assessment & Plan:

## 2021-11-12 NOTE — Patient Instructions (Addendum)
Follow up in 1 year or as needed We'll notify you of your lab results and make any changes if needed Keep up the good work on healthy diet and regular exercise- you're doing great!!! We'll call you with your Derm referral Call with any questions or concerns Stay Safe!  Stay Healthy! Have an AMAZING time!!!

## 2021-11-12 NOTE — Assessment & Plan Note (Signed)
Pt has gained 11 lbs since last visit.  Encouraged healthy diet and regular exercise.  Check labs to risk stratify.  Will follow.

## 2021-11-12 NOTE — Assessment & Plan Note (Signed)
Check labs and replete prn. 

## 2021-11-13 ENCOUNTER — Other Ambulatory Visit: Payer: Self-pay

## 2021-11-13 MED ORDER — VITAMIN D (ERGOCALCIFEROL) 1.25 MG (50000 UNIT) PO CAPS: 50000.0000 [IU] | ORAL_CAPSULE | ORAL | 0 refills | Status: DC

## 2021-11-13 NOTE — Progress Notes (Signed)
Patient viewed labs on my chart. Vitamin D has been sent to that pharmacy

## 2021-11-23 ENCOUNTER — Telehealth: Payer: No Typology Code available for payment source | Admitting: Nurse Practitioner

## 2021-11-23 DIAGNOSIS — U071 COVID-19: Secondary | ICD-10-CM

## 2021-11-23 MED ORDER — NIRMATRELVIR/RITONAVIR (PAXLOVID)TABLET: 3.0000 | ORAL_TABLET | Freq: Two times a day (BID) | ORAL | 0 refills | Status: AC

## 2021-11-23 MED ORDER — PROMETHAZINE-DM 6.25-15 MG/5ML PO SYRP: 5.0000 mL | ORAL_SOLUTION | Freq: Four times a day (QID) | ORAL | 0 refills | Status: AC | PRN

## 2021-11-23 MED ORDER — FLUTICASONE PROPIONATE 50 MCG/ACT NA SUSP: 2.0000 | Freq: Every day | NASAL | 0 refills | Status: DC

## 2021-11-23 NOTE — Progress Notes (Signed)
irtual Visit Consent   Kristen Floyd, you are scheduled for a virtual visit with a [facility] provider today.     Just as with appointments in the office, your consent must be obtained to participate.  Your consent will be active for this visit and any virtual visit you may have with one of our providers in the next 365 days.     If you have a MyChart account, a copy of this consent can be sent to you electronically.  All virtual visits are billed to your insurance company just like a traditional visit in the office.    As this is a virtual visit, video technology does not allow for your provider to perform a traditional examination.  This may limit your provider's ability to fully assess your condition.  If your provider identifies any concerns that need to be evaluated in person or the need to arrange testing (such as labs, EKG, etc.), we will make arrangements to do so.     Although advances in technology are sophisticated, we cannot ensure that it will always work on either your end or our end.  If the connection with a video visit is poor, the visit may have to be switched to a telephone visit.  With either a video or telephone visit, we are not always able to ensure that we have a secure connection.     I need to obtain your verbal consent now.   Are you willing to proceed with your visit today?    Shawan L Brabec has provided verbal consent on 11/23/2021 for a virtual visit (video or telephone).   Tish Men, NP   Date: 11/23/2021 9:47 AM   Virtual Visit via Video Note   I, [place], connected with  Kristen Floyd  ([id number], [birth year]) on 11/23/21 at  9:45 AM EST by a video-enabled telemedicine application and verified that I am speaking with the correct person using two identifiers.  Location: Patient: Virtual Visit Location Patient: Home Provider: Virtual Visit Location Provider: Home   I discussed the limitations of evaluation and management by  telemedicine and the availability of in person appointments. The patient expressed understanding and agreed to proceed.    History of Present Illness: Kristen Floyd is a 41 y.o. who identifies as a female who was assigned female at birth, and is being seen today for COVID symptoms. She tested positive for COVID on 2/11. Symptoms include HA, bodyaches, sore throat, nasal congestion, runny nose and coughing. Patient has a history of asthma, she used her inhaler on yesterday. She also took Ibuprofen for her HA. She has received the J/J vaccine and a booster.  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   Allergic rhinitis 07/18/2012    Allergies:  Allergies  Allergen Reactions   Amoxicillin Other (See Comments)    Double Vision   Medications:  Current Outpatient Medications:    albuterol (PROVENTIL HFA;VENTOLIN HFA) 108 (90 Base) MCG/ACT inhaler, Inhale 2 puffs into the lungs every 6 (six) hours as needed for wheezing or shortness of breath., Disp: 1 Inhaler, Rfl: 2   Cholecalciferol (  VITAMIN D-3) 5000 units TABS, Take 1 tablet by mouth every other day., Disp: , Rfl:    EPINEPHrine 0.3 mg/0.3 mL IJ SOAJ injection, See admin instructions., Disp: , Rfl:    INTROVALE 0.15-0.03 MG tablet, Take 1 tablet by mouth daily., Disp: , Rfl: 5   Vitamin D, Ergocalciferol, (DRISDOL) 1.25 MG (50000 UNIT) CAPS capsule, Take 1 capsule (50,000 Units total) by mouth every 7 (seven) days., Disp: 12 capsule, Rfl: 0  Observations/Objective: Patient is well-developed, well-nourished in no acute distress.  Resting comfortably  at home.  Head is normocephalic,  atraumatic.  No labored breathing.  Speech is clear and coherent with logical content.  Patient is alert and oriented at baseline.    Assessment and Plan: 1. COVID-19 - nirmatrelvir/ritonavir EUA (PAXLOVID) 20 x 150 MG & 10 x 100MG  TABS; Take 3 tablets by mouth 2 (two) times daily for 5 days. (Take nirmatrelvir 150 mg two tablets twice daily for 5 days and ritonavir 100 mg one tablet twice daily for 5 days) Patient GFR is 0.84 on 11/12/21.  Dispense: 30 tablet; Refill: 0 - promethazine-dextromethorphan (PROMETHAZINE-DM) 6.25-15 MG/5ML syrup; Take 5 mLs by mouth 4 (four) times daily as needed for up to 7 days for cough.  Dispense: 140 mL; Refill: 0 - fluticasone (FLONASE) 50 MCG/ACT nasal spray; Place 2 sprays into both nostrils daily for 10 days.  Dispense: 16 g; Refill: 0  Patient is high risk with history of asthma. Will prescribe antiviral. Encouraged increasing fluids, getting plenty or rest.Symptomatic treatment provided for cough, nasal symptoms. Will provide work note for 5 days. Patient to go to the ER if worsening cough, shortness of breath, difficulty breathing. Follow-up with PCP as needed.   Follow Up Instructions: I discussed the assessment and treatment plan with the patient. The patient was provided an opportunity to ask questions and all were answered. The patient agreed with the plan and demonstrated an understanding of the instructions.  A copy of instructions were sent to the patient via MyChart unless otherwise noted below.    The patient was advised to call back or seek an in-person evaluation if the symptoms worsen or if the condition fails to improve as anticipated.  Time:  I spent 12 minutes with the patient via telehealth technology discussing the above problems/concerns.    Tish Men, NP

## 2021-11-23 NOTE — Patient Instructions (Addendum)
[place], thank you for joining Tish Men, NP for today's virtual visit.  While this provider is not your primary care provider (PCP), if your PCP is located in our provider database this encounter information will be shared with them immediately following your visit.  Consent: (Patient) Kristen Floyd provided verbal consent for this virtual visit at the beginning of the encounter.  Current Medications:  Current Outpatient Medications:    fluticasone (FLONASE) 50 MCG/ACT nasal spray, Place 2 sprays into both nostrils daily for 10 days., Disp: 16 g, Rfl: 0   nirmatrelvir/ritonavir EUA (PAXLOVID) 20 x 150 MG & 10 x 100MG  TABS, Take 3 tablets by mouth 2 (two) times daily for 5 days. (Take nirmatrelvir 150 mg two tablets twice daily for 5 days and ritonavir 100 mg one tablet twice daily for 5 days) Patient GFR is 0.84 on 11/12/21., Disp: 30 tablet, Rfl: 0   promethazine-dextromethorphan (PROMETHAZINE-DM) 6.25-15 MG/5ML syrup, Take 5 mLs by mouth 4 (four) times daily as needed for up to 7 days for cough., Disp: 140 mL, Rfl: 0   albuterol (PROVENTIL HFA;VENTOLIN HFA) 108 (90 Base) MCG/ACT inhaler, Inhale 2 puffs into the lungs every 6 (six) hours as needed for wheezing or shortness of breath., Disp: 1 Inhaler, Rfl: 2   Cholecalciferol (VITAMIN D-3) 5000 units TABS, Take 1 tablet by mouth every other day., Disp: , Rfl:    EPINEPHrine 0.3 mg/0.3 mL IJ SOAJ injection, See admin instructions., Disp: , Rfl:    INTROVALE 0.15-0.03 MG tablet, Take 1 tablet by mouth daily., Disp: , Rfl: 5   Vitamin D, Ergocalciferol, (DRISDOL) 1.25 MG (50000 UNIT) CAPS capsule, Take 1 capsule (50,000 Units total) by mouth every 7 (seven) days., Disp: 12 capsule, Rfl: 0   Medications ordered in this encounter:  Meds ordered this encounter  Medications   nirmatrelvir/ritonavir EUA (PAXLOVID) 20 x 150 MG & 10 x 100MG  TABS    Sig: Take 3 tablets by mouth 2 (two) times daily for 5 days. (Take nirmatrelvir 150  mg two tablets twice daily for 5 days and ritonavir 100 mg one tablet twice daily for 5 days) Patient GFR is 0.84 on 11/12/21.    Dispense:  30 tablet    Refill:  0   promethazine-dextromethorphan (PROMETHAZINE-DM) 6.25-15 MG/5ML syrup    Sig: Take 5 mLs by mouth 4 (four) times daily as needed for up to 7 days for cough.    Dispense:  140 mL    Refill:  0   fluticasone (FLONASE) 50 MCG/ACT nasal spray    Sig: Place 2 sprays into both nostrils daily for 10 days.    Dispense:  16 g    Refill:  0     *If you need refills on other medications prior to your next appointment, please contact your pharmacy*  Follow-Up: Call back or seek an in-person evaluation if the symptoms worsen or if the condition fails to improve as anticipated.  Other Instructions Take medication as directed. Encouraged increasing fluids, getting plenty or rest.Symptomatic treatment provided for cough, nasal symptoms. Will provide work note for 5 days. Patient to go to the ER if worsening cough, shortness of breath, difficulty breathing. Follow-up with PCP as needed.    If you have been instructed to have an in-person evaluation today at a local Urgent Care facility, please use the link below. It will take you to a list of all of our available [facility], including address, phone number and hours of  operation. Please do not delay care.  [facility]  If you or a family member do not have a primary care provider, use the link below to schedule a visit and establish care. When you choose a [facility] primary care physician or advanced practice provider, you gain a long-term partner in health. Find a Primary Care Provider  Learn more about [facility]'s in-office and virtual care options: [facility]

## 2022-02-09 ENCOUNTER — Encounter: Payer: Self-pay | Admitting: Family Medicine

## 2022-02-11 ENCOUNTER — Telehealth (INDEPENDENT_AMBULATORY_CARE_PROVIDER_SITE_OTHER): Payer: No Typology Code available for payment source | Admitting: Family Medicine

## 2022-02-11 ENCOUNTER — Encounter: Payer: Self-pay | Admitting: Family Medicine

## 2022-02-11 DIAGNOSIS — R058 Other specified cough: Secondary | ICD-10-CM | POA: Diagnosis not present

## 2022-02-11 MED ORDER — PROMETHAZINE-DM 6.25-15 MG/5ML PO SYRP: 5.0000 mL | ORAL_SOLUTION | Freq: Four times a day (QID) | ORAL | 0 refills | Status: DC | PRN

## 2022-02-11 MED ORDER — PREDNISONE 10 MG PO TABS: ORAL_TABLET | ORAL | 0 refills | Status: DC

## 2022-02-11 NOTE — Progress Notes (Signed)
? ?  Virtual Visit via Video  ? ?I connected with patient on 02/11/22 at  9:40 AM EDT by a video enabled telemedicine application and verified that I am speaking with the correct person using two identifiers. ? ?Location patient: Home ?Location provider: [facility], Office ?Persons participating in the virtual visit: Patient, Provider, Center Marcille Blanco C) ? ?I discussed the limitations of evaluation and management by telemedicine and the availability of in person appointments. The patient expressed understanding and agreed to proceed. ? ?Subjective:  ? ?HPI:  ? ?Cough- sxs started ~2 weeks ago.  Had [place] in Feb.  Thinking back, may have had mild cough since COVID.  But 4/18 developed sore throat- thought it was allergies (she gets allergy shots).  Next day developed cough, HA.  Was negative for repeat COVID.  Sxs resolved w/ exception of cough.  Cough is productive of clear sputum.  Has been doing honey, OTC meds w/o relief.  Pt reports feeling fine w/ exception of cough. ? ?ROS:  ? ?See pertinent positives and negatives per HPI. ? ?Patient Active Problem List  ? Diagnosis Date Noted  ? Allergic rhinitis due to animal (cat) (dog) hair and dander 12/20/2020  ? Allergic rhinitis due to pollen 12/20/2020  ? Chronic allergic conjunctivitis 12/20/2020  ? Mild intermittent asthma 10/22/2020  ? Obesity (BMI 30-39.9) 11/10/2019  ? Vitamin D deficiency 11/05/2017  ? Physical exam 10/29/2015  ? Thyromegaly 10/29/2015  ? Allergic rhinitis 07/18/2012  ?  ?Social History  ? ?Tobacco Use  ? Smoking status: Never  ? Smokeless tobacco: Never  ?Substance Use Topics  ? Alcohol use: Yes  ?  Comment: drinks very rarely  ? ? ?Current Outpatient Medications:  ?  albuterol (PROVENTIL HFA;VENTOLIN HFA) 108 (90 Base) MCG/ACT inhaler, Inhale 2 puffs into the lungs every 6 (six) hours as needed for wheezing or shortness of breath., Disp: 1 Inhaler, Rfl: 2 ?  Cholecalciferol (VITAMIN D-3) 5000 units TABS, Take 1 tablet by mouth every  other day., Disp: , Rfl:  ?  EPINEPHrine 0.3 mg/0.3 mL IJ SOAJ injection, See admin instructions., Disp: , Rfl:  ?  INTROVALE 0.15-0.03 MG tablet, Take 1 tablet by mouth daily., Disp: , Rfl: 5 ?  fluticasone (FLONASE) 50 MCG/ACT nasal spray, Place 2 sprays into both nostrils daily for 10 days., Disp: 16 g, Rfl: 0 ?  Vitamin D, Ergocalciferol, (DRISDOL) 1.25 MG (50000 UNIT) CAPS capsule, Take 1 capsule (50,000 Units total) by mouth every 7 (seven) days., Disp: 12 capsule, Rfl: 0 ? ?Allergies  ?Allergen Reactions  ? Amoxicillin Other (See Comments)  ?  Double Vision  ? ? ?Objective:  ? ?There were no vitals taken for this visit. ?AAOx3, NAD ?NCAT, EOMI ?No obvious CN deficits ?Coloring WNL ?Pt is able to speak clearly, coherently without shortness of breath or increased work of breathing.  ?Dry, nearly continuous cough ?Thought process is linear.  Mood is appropriate.  ? ?Assessment and Plan:  ? ?Postviral cough- new.  Pt reports feeling well w/ exception of near continuous cough that interferes w/ speaking, sleeping, and just about everything.  She has a hx of RAD and I explained that she likely has a lot of airway inflammation after back to back viral illness.  Will start Prednisone taper, cough syrup as needed, and she is to use her albuterol inhaler if in a coughing fit.  Pt expressed understanding and is in agreement w/ plan.  ? ? ?Annye Asa, MD ?02/11/2022 ? ?

## 2022-07-08 ENCOUNTER — Ambulatory Visit
Admission: EM | Admit: 2022-07-08 | Discharge: 2022-07-08 | Disposition: A | Discharge status: Home / Self Care | Payer: No Typology Code available for payment source | Attending: Physician Assistant | Admitting: Physician Assistant

## 2022-07-08 DIAGNOSIS — M545 Low back pain, unspecified: Secondary | ICD-10-CM | POA: Diagnosis not present

## 2022-07-08 MED ORDER — PREDNISONE 20 MG PO TABS: 40.0000 mg | ORAL_TABLET | Freq: Every day | ORAL | 0 refills | Status: AC

## 2022-07-08 MED ORDER — CYCLOBENZAPRINE HCL 10 MG PO TABS: 10.0000 mg | ORAL_TABLET | Freq: Two times a day (BID) | ORAL | 0 refills | Status: DC | PRN

## 2022-07-08 NOTE — ED Provider Notes (Signed)
[facility]    CSN: [id number] Arrival date & time: 07/08/22  1417      History   Chief Complaint Chief Complaint  Patient presents with   Back Pain    Unresolved back pain 2.5 weeks - Entered by patient    HPI Kristen Floyd is a 41 y.o. female.   Patient here today for evaluation of back pain to her right upper lumbar region that has been present for the last 2 and half weeks.  She reports that symptoms have waxed and waned but seemingly have worsened over the last 2 days.  She reports that she does a lot of sitting as she works from home and is not sure if the twisting and posture is causing her back pain.  She has tried taking Flexeril with some relief as well as Epsom salt baths which have been helpful.  She did take ibuprofen on 1 occasion but did not feel like this was very effective.  She does not report any numbness or tingling.  She has not had any dysuria or blood in her urine.  She denies any nausea or vomiting.  She has not had fever.  Certain movements seem to worsen pain.  The history is provided by the patient.  Back Pain Associated symptoms: no abdominal pain, no dysuria, no fever and no numbness     Past Medical History:  Diagnosis Date   Allergy    Asthma    Chicken pox    Dermoid cyst    age 23; removed ovary, tube and appendix   HPV (human papilloma virus) infection    Normal pap smears x 2 years.    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 Authorizing Provider  cyclobenzaprine (FLEXERIL) 10 MG tablet Take 1 tablet (10 mg total) by mouth 2 (two) times daily as needed for muscle spasms. 07/08/22  Yes Francene Finders, PA-C  predniSONE (DELTASONE) 20 MG tablet Take 2 tablets (40 mg total) by mouth daily with breakfast for 5 days. 07/08/22 07/13/22 Yes Francene Finders, PA-C  albuterol (PROVENTIL HFA;VENTOLIN HFA) 108 (90 Base) MCG/ACT inhaler Inhale 2 puffs into the lungs every 6 (six) hours as needed for wheezing or shortness of  breath. 11/05/17   Midge Minium, MD  Cholecalciferol (VITAMIN D-3) 5000 units TABS Take 1 tablet by mouth every other day.    [provider]  EPINEPHrine 0.3 mg/0.3 mL IJ SOAJ injection See admin instructions.    [provider]  fluticasone (FLONASE) 50 MCG/ACT nasal spray Place 2 sprays into both nostrils daily for 10 days. 11/23/21 12/03/21  Leath-Warren, Alda Lea, NP  INTROVALE 0.15-0.03 MG tablet Take 1 tablet by mouth daily. 08/26/17   [provider]  promethazine-dextromethorphan (PROMETHAZINE-DM) 6.25-15 MG/5ML syrup Take 5 mLs by mouth 4 (four) times daily as needed. 02/11/22   Midge Minium, MD  Vitamin D, Ergocalciferol, (DRISDOL) 1.25 MG (50000 UNIT) CAPS capsule Take 1 capsule (50,000 Units total) by mouth every 7 (seven) days. 11/13/21   Midge Minium, MD    Family History Family History  Problem Relation Age of Onset   Sickle cell anemia Mother    Arthritis Mother    Cancer Maternal Grandmother        lymphoma   Alzheimer's disease Maternal Grandmother    Cancer Paternal Grandmother         uterine   Diabetes Paternal Grandmother    COPD Paternal Grandmother    Uterine cancer Paternal Grandmother    Hypertension Paternal Grandmother    Asthma Brother    Asthma Brother    Sickle cell trait Brother     Social History Social History   Tobacco Use   Smoking status: Never   Smokeless tobacco: Never  Vaping Use   Vaping Use: Never used  Substance Use Topics   Alcohol use: Yes    Comment: drinks very rarely   Drug use: No     Allergies   Amoxicillin   Review of Systems Review of Systems  Constitutional:  Negative for chills and fever.  Eyes:  Negative for discharge and redness.  Gastrointestinal:  Negative for abdominal pain, nausea and vomiting.  Genitourinary:  Negative for dysuria and hematuria.  Musculoskeletal:  Positive for back pain and myalgias.  Neurological:  Negative for numbness.     Physical Exam Triage Vital Signs ED Triage Vitals  Enc Vitals Group     BP      Pulse      Resp      Temp      Temp src      SpO2      Weight      Height      Head Circumference      Peak Flow      Pain Score      Pain Loc      Pain Edu?      Excl. in [place]?    No data found.  Updated Vital Signs BP 136/88 (BP Location: Left Arm)   Pulse 98   Temp 98.3 F (36.8 C) (Oral)   Resp 18   LMP 06/10/2022   SpO2 98%   Physical Exam Vitals and nursing note reviewed.  Constitutional:      General: She is not in acute distress.    Appearance: Normal appearance. She is not ill-appearing.  HENT:     Head: Normocephalic and atraumatic.  Eyes:     Conjunctiva/sclera: Conjunctivae normal.  Cardiovascular:     Rate and Rhythm: Normal rate.  Pulmonary:     Effort: Pulmonary effort is normal. No respiratory distress.  Musculoskeletal:     Comments: No tenderness palpation noted to thoracic or lumbar spine.  Mild  tenderness palpation noted to upper right lumbar back around CVA  Neurological:     Mental Status: She is alert.  Psychiatric:        Mood and  Affect: Mood normal.        Behavior: Behavior normal.        Thought Content: Thought content normal.      UC Treatments / Results  Labs (all labs ordered are listed, but only abnormal results are displayed) Labs Reviewed - No data to display  EKG   Radiology No results found.  Procedures Procedures (including critical care time)  Medications Ordered in UC Medications - No data to display  Initial Impression / Assessment and Plan / UC Course  I have reviewed the triage vital signs and the nursing notes.  Pertinent labs & imaging results that were available during my care of the patient were reviewed by me and considered in my medical decision making (see chart for details).    Given location of pain there is some concern for kidney issue however given worsening pain with movement and lack of other urinary symptoms suspect most likely musculoskeletal.  Will treat with steroid burst and muscle relaxer and encouraged follow-up with primary care in the event that symptoms are not improving or worsen.  Patient expressed understanding.  Final Clinical Impressions(s) / UC Diagnoses   Final diagnoses:  Pain in right lumbar region of back   Discharge Instructions   None    ED Prescriptions     Medication Sig Dispense Auth. Provider   predniSONE (DELTASONE) 20 MG tablet Take 2 tablets (40 mg total) by mouth daily with breakfast for 5 days. 10 tablet Ewell Poe F, PA-C   cyclobenzaprine (FLEXERIL) 10 MG tablet Take 1 tablet (10 mg total) by mouth 2 (two) times daily as needed for muscle spasms. 20 tablet Francene Finders, PA-C      PDMP not reviewed this encounter.   Francene Finders, PA-C 07/08/22 1555

## 2022-07-08 NOTE — ED Triage Notes (Signed)
Pt c/o mid back pain x 2.5 weeks.   Home interventions: motrin

## 2022-07-21 ENCOUNTER — Encounter: Payer: Self-pay | Admitting: Family Medicine

## 2022-07-22 NOTE — Telephone Encounter (Signed)
Spoke to pt and  made her an apt   for tomorrow at 920 with Dr Birdie Riddle

## 2022-07-23 ENCOUNTER — Encounter: Payer: Self-pay | Admitting: Family Medicine

## 2022-07-23 ENCOUNTER — Ambulatory Visit (INDEPENDENT_AMBULATORY_CARE_PROVIDER_SITE_OTHER): Payer: No Typology Code available for payment source | Admitting: Family Medicine

## 2022-07-23 VITALS — BP 112/60 | HR 50 | Temp 97.8°F | Resp 17 | Ht 66.5 in | Wt 196.4 lb

## 2022-07-23 DIAGNOSIS — M545 Low back pain, unspecified: Secondary | ICD-10-CM | POA: Diagnosis not present

## 2022-07-23 MED ORDER — METHOCARBAMOL 500 MG PO TABS: 500.0000 mg | ORAL_TABLET | Freq: Three times a day (TID) | ORAL | 0 refills | Status: DC | PRN

## 2022-07-23 MED ORDER — PREDNISONE 10 MG PO TABS: ORAL_TABLET | ORAL | 0 refills | Status: DC

## 2022-07-23 NOTE — Progress Notes (Signed)
   Subjective:    Patient ID: Kristen Floyd, female    DOB: 04/30/1981, 41 y.o.   MRN: [id number]  HPI Back pain- sxs started ~1 month ago.  R sided.  She was sitting at her desk when she leaned to the side to grab something and she immediately felt the spasm.  Went to ER on 9/27 and was started on '40mg'$  Prednisone x5 days and Flexeril.  Pain did not improve w/ either.  Pt has no problem sleeping, wakes up pain free.  Pt has pain about 3 hrs into her work day.  Heating pad worsens pain.   Review of Systems For ROS see HPI     Objective:   Physical Exam Vitals reviewed.  Constitutional:      General: She is not in acute distress.    Appearance: Normal appearance. She is not ill-appearing.  HENT:     Head: Normocephalic and atraumatic.  Musculoskeletal:        General: Tenderness (TTP over R lumbar paraspinal muscles w/ spasm) present. No swelling.  Skin:    General: Skin is warm and dry.     Findings: No rash.  Neurological:     General: No focal deficit present.     Mental Status: She is alert and oriented to person, place, and time.     Cranial Nerves: No cranial nerve deficit.     Motor: No weakness.     Coordination: Coordination normal.     Gait: Gait normal.     Comments: (-) SLR x2  Psychiatric:        Mood and Affect: Mood normal.        Behavior: Behavior normal.        Thought Content: Thought content normal.           Assessment & Plan:   R LBP- new.  Pt reports sxs started when leaning sideways from her desk ~1 month.  Since then, pain will resolve overnight but as her work day progresses, she will develop pain and tightness.  Discussed the ergonomics of her work environment and that she may need a new chair.  Encouraged lumbar support.  Start Prednisone taper and methocarbamol.  Reviewed supportive care and red flags that should prompt return.  Pt expressed understanding and is in agreement w/ plan.

## 2022-07-23 NOTE — Patient Instructions (Signed)
Follow up as needed or as scheduled Start the Prednisone as directed- take w/ food USE the Methocarbamol as needed for spasm Do some gentle stretching to avoid stiffness Try a lumbar support pillow for your work chair Call with any questions or concerns Hang in there!!!

## 2022-10-12 ENCOUNTER — Ambulatory Visit
Admission: EM | Admit: 2022-10-12 | Discharge: 2022-10-12 | Disposition: A | Discharge status: Home / Self Care | Payer: No Typology Code available for payment source

## 2022-10-12 DIAGNOSIS — L089 Local infection of the skin and subcutaneous tissue, unspecified: Secondary | ICD-10-CM | POA: Diagnosis not present

## 2022-10-12 DIAGNOSIS — L729 Follicular cyst of the skin and subcutaneous tissue, unspecified: Secondary | ICD-10-CM | POA: Diagnosis not present

## 2022-10-12 NOTE — Discharge Instructions (Addendum)
Please change your dressing 2-3 times daily. Do not apply any ointments or creams. Each time you change your dressing, make sure that you are pressing on the wound to get pus to come out.  Try your best to clean the wound with antibacterial soap and warm water. Pat your wound dry and let it air out if possible to make sure it is dry before reapplying another dressing. Use non-adherent dressing and secured with Coban.

## 2022-10-12 NOTE — ED Triage Notes (Signed)
Pt states that she has a cyst on her chest. Pt states that this has been ongoing for a couple of years. Pt states that it started to swell x2 weeks ago.

## 2022-10-12 NOTE — ED Provider Notes (Signed)
[facility]  Note:  This document was prepared using Systems analyst and may include unintentional dictation errors.  MRN: [id number] DOB: 24-Jan-1981  Subjective:   Kristen Floyd is a 42 y.o. female presenting for acute onset of painful red area around her cyst that she has had for 2 years.  Patient is being followed by dermatology.  Has had a history of cysts and keloids.  Has an appointment coming up next month.  She is currently taking doxycycline in preparation for a procedure that she is having with the dermatologist practice.  No current facility-administered medications for this encounter.  Current Outpatient Medications:    albuterol (PROVENTIL HFA;VENTOLIN HFA) 108 (90 Base) MCG/ACT inhaler, Inhale 2 puffs into the lungs every 6 (six) hours as needed for wheezing or shortness of breath., Disp: 1 Inhaler, Rfl: 2   Cholecalciferol (VITAMIN D-3) 5000 units TABS, Take 1 tablet by mouth every other day., Disp: , Rfl:    doxycycline (VIBRAMYCIN) 100 MG capsule, Take 100 mg by mouth 2 (two) times daily., Disp: , Rfl:    EPINEPHrine 0.3 mg/0.3 mL IJ SOAJ injection, See admin instructions., Disp: , Rfl:    INTROVALE 0.15-0.03 MG tablet, Take 1 tablet by mouth daily., Disp: , Rfl: 5   methocarbamol (ROBAXIN) 500 MG tablet, Take 1 tablet (500 mg total) by mouth every 8 (eight) hours as needed for muscle spasms., Disp: 45 tablet, Rfl: 0   predniSONE (DELTASONE) 10 MG tablet, 3 tabs x3 days and then 2 tabs x3 days and then 1 tab x3 days.  Take w/ food., Disp: 18 tablet, Rfl: 0   Allergies  Allergen Reactions   Amoxicillin Other (See Comments)    Double Vision    Past Medical History:  Diagnosis Date   Allergy    Asthma    Chicken pox    Dermoid cyst    age 21; removed ovary, tube and appendix   HPV (human papilloma virus) infection    Normal pap smears x 2 years.     Past Surgical History:  Procedure Laterality Date   APPENDECTOMY      age 77   DERMOID CYST  EXCISION  age 59   right tube and ovary removed   RT salpingoopherectomy     age 66   WISDOM TOOTH EXTRACTION  2007    Family History  Problem Relation Age of Onset   Sickle cell anemia Mother    Arthritis Mother    Cancer Maternal Grandmother        lymphoma   Alzheimer's disease Maternal Grandmother    Cancer Paternal Grandmother        uterine   Diabetes Paternal Grandmother    COPD Paternal Grandmother    Uterine cancer Paternal Grandmother    Hypertension Paternal Grandmother    Asthma Brother    Asthma Brother    Sickle cell trait Brother     Social History   Tobacco Use   Smoking status: Never   Smokeless tobacco: Never  Vaping Use   Vaping Use: Never used  Substance Use Topics   Alcohol use: Yes    Comment: drinks very rarely   Drug use: No    ROS   Objective:   Vitals: BP (!) 140/101   Pulse 84   Temp 98.4 F (36.9 C) (Oral)   Resp 18   Ht '5\' 7"'$  (1.702 m)   Wt 194 lb (88 kg)   LMP 09/09/2022  SpO2 97%   BMI 30.38 kg/m   Physical Exam Constitutional:      General: She is not in acute distress.    Appearance: Normal appearance. She is well-developed. She is not ill-appearing, toxic-appearing or diaphoretic.  HENT:     Head: Normocephalic and atraumatic.     Nose: Nose normal.     Mouth/Throat:     Mouth: Mucous membranes are moist.  Eyes:     General: No scleral icterus.       Right eye: No discharge.        Left eye: No discharge.     Extraocular Movements: Extraocular movements intact.  Cardiovascular:     Rate and Rhythm: Normal rate.  Pulmonary:     Effort: Pulmonary effort is normal.  Chest:    Skin:    General: Skin is warm and dry.  Neurological:     General: No focal deficit present.     Mental Status: She is alert and oriented to person, place, and time.  Psychiatric:        Mood and Affect: Mood normal.        Behavior: Behavior normal.     PROCEDURE NOTE: I&D of Abscess Verbal consent  obtained. Local anesthesia with 3cc of 2% lidocaine with epinephrine. Site cleansed with Betadine. Incision of 1cm was made using an 11 blade, 2cc expressed consisting of a mixture of pus and serosanguinous fluid. Wound cavity was explored with curved hemostats and loculations loosened. ~1.5cm cyst was dissected from the wound.  Cleansed and dressed.   Assessment and Plan :   PDMP not reviewed this encounter.  1. Infected cyst of skin     Patient did not want to switch the antibiotic course.  I offered to place 1 suture for closer approximation but patient declined.  Will follow-up with her dermatologist. Counseled patient on potential for adverse effects with medications prescribed/recommended today, ER and return-to-clinic precautions discussed, patient verbalized understanding.    [name], PA-C 10/12/22 1725

## 2022-11-13 ENCOUNTER — Encounter: Payer: Self-pay | Admitting: Family Medicine

## 2022-11-13 ENCOUNTER — Ambulatory Visit (INDEPENDENT_AMBULATORY_CARE_PROVIDER_SITE_OTHER): Payer: No Typology Code available for payment source | Admitting: Family Medicine

## 2022-11-13 VITALS — BP 128/84 | HR 85 | Temp 97.6°F | Ht 67.0 in | Wt 200.2 lb

## 2022-11-13 DIAGNOSIS — E669 Obesity, unspecified: Secondary | ICD-10-CM

## 2022-11-13 DIAGNOSIS — E559 Vitamin D deficiency, unspecified: Secondary | ICD-10-CM | POA: Diagnosis not present

## 2022-11-13 DIAGNOSIS — Z0000 Encounter for general adult medical examination without abnormal findings: Secondary | ICD-10-CM

## 2022-11-13 NOTE — Progress Notes (Signed)
   Subjective:    Patient ID: Kristen Floyd, female    DOB: 10-18-1980, 42 y.o.   MRN: [id number]  HPI CPE- UTD on pap, Tdap  Patient Care Team    Relationship Specialty Notifications Start End  Sheliah Hatch, MD PCP - General Family Medicine  07/18/12   Cordelia Poche, MD Referring Physician Obstetrics and Gynecology  10/29/15      Health Maintenance  Topic Date Due   INFLUENZA VACCINE  01/11/2023 (Originally 05/12/2022)   PAP SMEAR-Modifier  12/13/2023   DTaP/Tdap/Td (3 - Td or Tdap) 11/04/2026   Hepatitis C Screening  Completed   HIV Screening  Completed   HPV VACCINES  Aged Out   COVID-19 Vaccine  Discontinued      Review of Systems Patient reports no vision/ hearing changes, adenopathy,fever, weight change,  persistant/recurrent hoarseness , swallowing issues, chest pain, palpitations, edema, persistant/recurrent cough, hemoptysis, dyspnea (rest/exertional/paroxysmal nocturnal), gastrointestinal bleeding (melena, rectal bleeding), abdominal pain, significant heartburn, bowel changes, GU symptoms (dysuria, hematuria, incontinence), Gyn symptoms (abnormal  bleeding, pain),  syncope, focal weakness, memory loss, numbness & tingling, skin/hair/nail changes, abnormal bruising or bleeding, anxiety, or depression.     Objective:   Physical Exam General Appearance:    Alert, cooperative, no distress, appears stated age  Head:    Normocephalic, without obvious abnormality, atraumatic  Eyes:    PERRL, conjunctiva/corneas clear, EOM's intact both eyes  Ears:    Normal TM's and external ear canals, both ears  Nose:   Nares normal, septum midline, mucosa normal, no drainage    or sinus tenderness  Throat:   Lips, mucosa, and tongue normal; teeth and gums normal  Neck:   Supple, symmetrical, trachea midline, no adenopathy;    Thyroid: thyromegaly  Back:     Symmetric, no curvature, ROM normal, no CVA tenderness  Lungs:     Clear to auscultation bilaterally, respirations unlabored   Chest Wall:    No tenderness or deformity   Heart:    Regular rate and rhythm, S1 and S2 normal, no murmur, rub   or gallop  Breast Exam:    Deferred to GYN  Abdomen:     Soft, non-tender, bowel sounds active all four quadrants,    no masses, no organomegaly  Genitalia:    Deferred to GYN  Rectal:    Extremities:   Extremities normal, atraumatic, no cyanosis or edema  Pulses:   2+ and symmetric all extremities  Skin:   Skin color, texture, turgor normal, no rashes or lesions  Lymph nodes:   Cervical, supraclavicular, and axillary nodes normal  Neurologic:   CNII-XII intact, normal strength, sensation and reflexes    throughout          Assessment & Plan:

## 2022-11-13 NOTE — Patient Instructions (Signed)
Follow up in 1 year or as needed We'll notify you of your lab results and make any changes if needed Continue to work on healthy diet and regular exercise- you look great! Call with any questions or concerns Stay Safe!  Stay Healthy!!! Have a great weekend!!!

## 2022-11-14 LAB — HEPATIC FUNCTION PANEL
AG Ratio: 1.7 (calc) (ref 1.0–2.5)
ALT: 21 U/L (ref 6–29)
AST: 13 U/L (ref 10–30)
Albumin: 4.5 g/dL (ref 3.6–5.1)
Alkaline phosphatase (APISO): 62 U/L (ref 31–125)
Bilirubin, Direct: 0.1 mg/dL (ref 0.0–0.2)
Globulin: 2.6 g/dL (calc) (ref 1.9–3.7)
Indirect Bilirubin: 0.4 mg/dL (calc) (ref 0.2–1.2)
Total Bilirubin: 0.5 mg/dL (ref 0.2–1.2)
Total Protein: 7.1 g/dL (ref 6.1–8.1)

## 2022-11-14 LAB — BASIC METABOLIC PANEL
BUN: 8 mg/dL (ref 7–25)
CO2: 27 mmol/L (ref 20–32)
Calcium: 9.7 mg/dL (ref 8.6–10.2)
Chloride: 106 mmol/L (ref 98–110)
Creat: 0.86 mg/dL (ref 0.50–0.99)
Glucose, Bld: 81 mg/dL (ref 65–99)
Potassium: 4.2 mmol/L (ref 3.5–5.3)
Sodium: 141 mmol/L (ref 135–146)

## 2022-11-14 LAB — CBC WITH DIFFERENTIAL/PLATELET
Absolute Monocytes: 674 cells/uL (ref 200–950)
Basophils Absolute: 19 cells/uL (ref 0–200)
Basophils Relative: 0.3 %
Eosinophils Absolute: 151 cells/uL (ref 15–500)
Eosinophils Relative: 2.4 %
HCT: 38.6 % (ref 35.0–45.0)
Hemoglobin: 13.2 g/dL (ref 11.7–15.5)
Lymphs Abs: 2936 cells/uL (ref 850–3900)
MCH: 26 pg — ABNORMAL LOW (ref 27.0–33.0)
MCHC: 34.2 g/dL (ref 32.0–36.0)
MCV: 76.1 fL — ABNORMAL LOW (ref 80.0–100.0)
MPV: 9.5 fL (ref 7.5–12.5)
Monocytes Relative: 10.7 %
Neutro Abs: 2520 cells/uL (ref 1500–7800)
Neutrophils Relative %: 40 %
Platelets: 408 10*3/uL — ABNORMAL HIGH (ref 140–400)
RBC: 5.07 10*6/uL (ref 3.80–5.10)
RDW: 12.5 % (ref 11.0–15.0)
Total Lymphocyte: 46.6 %
WBC: 6.3 10*3/uL (ref 3.8–10.8)

## 2022-11-14 LAB — LIPID PANEL
Cholesterol: 177 mg/dL (ref <200)
HDL: 60 mg/dL (ref >=50)
LDL Cholesterol (Calc): 97 mg/dL (calc)
Non-HDL Cholesterol (Calc): 117 mg/dL (calc) (ref <130)
Total CHOL/HDL Ratio: 3 (calc) (ref <5.0)
Triglycerides: 100 mg/dL (ref <150)

## 2022-11-14 LAB — VITAMIN D 25 HYDROXY (VIT D DEFICIENCY, FRACTURES): Vit D, 25-Hydroxy: 20 ng/mL — ABNORMAL LOW (ref 30–100)

## 2022-11-14 LAB — TSH: TSH: 1.56 mIU/L

## 2022-11-16 ENCOUNTER — Telehealth: Payer: Self-pay

## 2022-11-16 ENCOUNTER — Other Ambulatory Visit: Payer: Self-pay

## 2022-11-16 DIAGNOSIS — E559 Vitamin D deficiency, unspecified: Secondary | ICD-10-CM

## 2022-11-16 MED ORDER — VITAMIN D (ERGOCALCIFEROL) 1.25 MG (50000 UNIT) PO CAPS: 50000.0000 [IU] | ORAL_CAPSULE | ORAL | 12 refills | Status: DC

## 2022-11-16 NOTE — Telephone Encounter (Signed)
-----   Message from Midge Minium, MD sent at 11/16/2022  7:42 AM EST ----- Labs look great w/ exception of low Vit D.  Based on this, we need to start 50,000 units weekly x12 weeks in addition to daily OTC supplement of at least 2000 units.

## 2022-11-16 NOTE — Telephone Encounter (Signed)
Informed pt of lab results and Vit d 50,000 unit sent to pharmacy

## 2022-11-19 DIAGNOSIS — J45909 Unspecified asthma, uncomplicated: Secondary | ICD-10-CM

## 2022-11-19 HISTORY — DX: Unspecified asthma, uncomplicated: J45.909

## 2023-01-05 ENCOUNTER — Encounter: Payer: Self-pay | Admitting: Family Medicine

## 2023-01-06 ENCOUNTER — Other Ambulatory Visit (HOSPITAL_BASED_OUTPATIENT_CLINIC_OR_DEPARTMENT_OTHER): Payer: Self-pay | Admitting: Family Medicine

## 2023-01-06 DIAGNOSIS — Z1231 Encounter for screening mammogram for malignant neoplasm of breast: Secondary | ICD-10-CM

## 2023-01-12 ENCOUNTER — Ambulatory Visit (HOSPITAL_BASED_OUTPATIENT_CLINIC_OR_DEPARTMENT_OTHER)
Admission: RE | Admit: 2023-01-12 | Discharge: 2023-01-12 | Disposition: A | Discharge status: Home / Self Care | Payer: No Typology Code available for payment source | Source: Ambulatory Visit | Attending: Family Medicine | Admitting: Family Medicine

## 2023-01-12 ENCOUNTER — Encounter (HOSPITAL_BASED_OUTPATIENT_CLINIC_OR_DEPARTMENT_OTHER): Payer: Self-pay

## 2023-01-12 DIAGNOSIS — Z1231 Encounter for screening mammogram for malignant neoplasm of breast: Secondary | ICD-10-CM | POA: Diagnosis present

## 2023-03-14 NOTE — Assessment & Plan Note (Signed)
Pt's PE WNL w/ exception of known thyromegaly and BMI.  UTD on pap, Tdap.  Check labs.  Anticipatory guidance provided.

## 2023-03-14 NOTE — Assessment & Plan Note (Signed)
Ongoing issue for pt.  Encouraged low carb diet and regular exercise.  Check labs to risk stratify.  Will follow. 

## 2023-03-14 NOTE — Assessment & Plan Note (Signed)
Check labs and replete prn. 

## 2023-10-06 ENCOUNTER — Telehealth: Payer: No Typology Code available for payment source | Admitting: Family Medicine

## 2023-10-06 DIAGNOSIS — J069 Acute upper respiratory infection, unspecified: Secondary | ICD-10-CM | POA: Diagnosis not present

## 2023-10-06 MED ORDER — BENZONATATE 100 MG PO CAPS: 100.0000 mg | ORAL_CAPSULE | Freq: Three times a day (TID) | ORAL | 0 refills | Status: AC | PRN

## 2023-10-06 MED ORDER — FLUTICASONE PROPIONATE 50 MCG/ACT NA SUSP: 2.0000 | Freq: Every day | NASAL | 0 refills | Status: DC

## 2023-10-06 NOTE — Progress Notes (Signed)

## 2023-10-13 ENCOUNTER — Encounter: Payer: Self-pay | Admitting: Family Medicine

## 2023-10-14 ENCOUNTER — Ambulatory Visit: Payer: No Typology Code available for payment source | Admitting: Family Medicine

## 2023-10-14 VITALS — BP 128/82 | HR 93 | Temp 98.7°F | Ht 67.0 in | Wt 189.8 lb

## 2023-10-14 DIAGNOSIS — R052 Subacute cough: Secondary | ICD-10-CM | POA: Diagnosis not present

## 2023-10-14 DIAGNOSIS — R062 Wheezing: Secondary | ICD-10-CM

## 2023-10-14 DIAGNOSIS — J22 Unspecified acute lower respiratory infection: Secondary | ICD-10-CM | POA: Diagnosis not present

## 2023-10-14 MED ORDER — AZITHROMYCIN 250 MG PO TABS: ORAL_TABLET | ORAL | 0 refills | Status: AC

## 2023-10-14 MED ORDER — PREDNISONE 20 MG PO TABS: 40.0000 mg | ORAL_TABLET | Freq: Every day | ORAL | 0 refills | Status: DC

## 2023-10-14 MED ORDER — GUAIFENESIN-CODEINE 100-10 MG/5ML PO SOLN: 5.0000 mL | Freq: Four times a day (QID) | ORAL | 0 refills | Status: DC | PRN

## 2023-10-14 NOTE — Patient Instructions (Signed)
 Sorry you still have that cough.  As we discussed it could have been a viral illness but I think covering for possible atypicals like mycoplasma and pertussis would be reasonable at this time.  Azithromycin  was sent to your pharmacy.  I also suspect you have a component of reactive airway, especially with increased use of albuterol .  Prednisone  40 mg for 3 days should calm it down.  Okay to still use albuterol  if needed for breakthrough wheeze.  Additionally I sent in some codeine  cough syrup if needed up to every 6 hours.  See other information below.  Keep us  posted but I suspect you will be feeling much better by early next week.  Follow-up if any new or worsening symptoms.  Take care!  Cough, Adult Coughing is a reflex that clears your throat and airways (respiratory system). It helps heal and protect your lungs. It is normal to cough from time to time. A cough that happens with other symptoms or that lasts a long time may be a sign of a condition that needs treatment. A short-term (acute) cough may only last 2-3 weeks. A long-term (chronic) cough may last 8 or more weeks. Coughing is often caused by: Diseases, such as: An infection of the respiratory system. Asthma or other heart or lung diseases. Gastroesophageal reflux. This is when acid comes back up from the stomach. Breathing in things that irritate your lungs. Allergies. Postnasal drip. This is when mucus runs down the back of your throat. Smoking. Some medicines. Follow these instructions at home: Medicines Take over-the-counter and prescription medicines only as told by your health care provider. Talk with your provider before you take cough medicine (cough suppressants). Eating and drinking Do not drink alcohol. Avoid caffeine. Drink enough fluid to keep your pee (urine) pale yellow. Lifestyle Avoid cigarette smoke. Do not use any products that contain nicotine or tobacco. These products include cigarettes, chewing tobacco, and  vaping devices, such as e-cigarettes. If you need help quitting, ask your provider. Avoid things that make you cough. These may include perfumes, candles, cleaning products, or campfire smoke. General instructions  Watch for any changes to your cough. Tell your provider about them. Always cover your mouth when you cough. If the air is dry in your bedroom or home, use a cool mist vaporizer or humidifier. If your cough is worse at night, try to sleep in a semi-upright position. Rest as needed. Contact a health care provider if: You have new symptoms, or your symptoms get worse. You cough up pus. You have a fever that does not go away or a cough that does not get better after 2-3 weeks. You cannot control your cough with medicine, and you are losing sleep. You have pain that gets worse or is not helped with medicine. You lose weight for no clear reason. You have night sweats. Get help right away if: You cough up blood. You have trouble breathing. Your heart is beating very fast. These symptoms may be an emergency. Get help right away. Call 911. Do not wait to see if the symptoms will go away. Do not drive yourself to the hospital. This information is not intended to replace advice given to you by your health care provider. Make sure you discuss any questions you have with your health care provider. Document Revised: 05/29/2022 Document Reviewed: 05/29/2022 Elsevier Patient Education  2024 [employer].

## 2023-10-14 NOTE — Progress Notes (Signed)
 Subjective:  Patient ID: Kristen Floyd, female    DOB: 1981/06/18  Age: 43 y.o. MRN: [id number]  CC:  Chief Complaint  Patient presents with   Cough    Pt notes cough started 10/02/23 and did E-visit christmas and has not improved at this time, negative home COVID tests, no known fever     HPI Kristen Floyd presents for   Cough Started 10/02/2023. Runny nose, sore throat, cough and ear pain.  Cough has persisted.  Negative home COVID test.  E-visit on 1225, diagnosed with upper respiratory infection.  Mucinex  recommended For congestion, Tessalon  Perles and Flonase  nasal spray were prescribed. No relief with tessalon  perles - used few times. Just dry throat.  Productive cough has persisted - prior yellow phlegm - now clear.  Sleeping ok - cough during the day.  No cough syncope. No posttussive emesis, but close initially.  No fever, chest pain. Some dyspnea - improves with albuterol . Tx: tea with honey, lemon. Expectorant, decongestant, suppressant. Some increased wheeze - has used albuterol  daily - up to 3 times as a day.             History Patient Active Problem List   Diagnosis Date Noted   Allergic rhinitis due to animal (cat) (dog) hair and dander 12/20/2020   Allergic rhinitis due to pollen 12/20/2020   Chronic allergic conjunctivitis 12/20/2020   Mild intermittent asthma 10/22/2020   Obesity (BMI 30-39.9) 11/10/2019   Vitamin D  deficiency 11/05/2017   Physical exam 10/29/2015   Thyromegaly 10/29/2015   Allergic rhinitis 07/18/2012   Past Medical History:  Diagnosis Date   Allergy    Asthma    Chicken pox    Dermoid cyst    age 25; removed ovary, tube and appendix   HPV (human papilloma virus) infection    Normal pap smears x 2 years.   Past Surgical History:  Procedure Laterality Date   APPENDECTOMY     age 41   DERMOID CYST  EXCISION  age 37   right tube and ovary removed   RT salpingoopherectomy     age 47   WISDOM TOOTH EXTRACTION  2007    Allergies  Allergen Reactions   Amoxicillin Other (See Comments)    Double Vision   Prior to Admission medications   Medication Sig Start Date End Date Taking? Authorizing Provider  albuterol  (PROVENTIL  HFA;VENTOLIN  HFA) 108 (90 Base) MCG/ACT inhaler Inhale 2 puffs into the lungs every 6 (six) hours as needed for wheezing or shortness of breath. 11/05/17  Yes Tabori, Katherine E, MD  Cholecalciferol (VITAMIN D -3) 5000 units TABS Take 1 tablet by mouth every other day.   Yes [provider]  EPINEPHrine 0.3 mg/0.3 mL IJ SOAJ injection See admin instructions.   Yes [provider]  INTROVALE 0.15-0.03 MG tablet Take 1 tablet by mouth daily. 08/26/17  Yes [provider]   Social History   Socioeconomic History   Marital status: Single    Spouse name: Not on file   Number of children: Not on file   Years of education: Not on file   Highest education level: Bachelor's degree (e.g., BA, AB, BS)  Occupational History   Not on file  Tobacco Use   Smoking status: Never   Smokeless tobacco: Never  Vaping Use   Vaping status: Never Used  Substance and Sexual Activity   Alcohol use: Yes    Comment: drinks very rarely   Drug use: No   Sexual  activity: Yes    Birth control/protection: Pill  Other Topics Concern   Not on file  Social History Narrative   Not on file   Social Drivers of Health   Financial Resource Strain: Low Risk  (10/14/2023)   Overall Financial Resource Strain (CARDIA)    Difficulty of Paying Living Expenses: Not hard at all  Food Insecurity: No Food Insecurity (10/14/2023)   Hunger Vital Sign    Worried About Running Out of Food in the Last Year: Never true    Ran Out of Food in the Last Year: Never true  Transportation Needs: No Transportation Needs (10/14/2023)   PRAPARE - Administrator, Civil Service (Medical): No    Lack of Transportation (Non-Medical): No  Physical Activity: Unknown (10/14/2023)   Exercise Vital Sign     Days of Exercise per Week: Patient declined    Minutes of Exercise per Session: Not on file  Stress: No Stress Concern Present (10/14/2023)   [employer] of Occupational Health - Occupational Stress Questionnaire    Feeling of Stress : Not at all  Social Connections: Unknown (10/14/2023)   Social Connection and Isolation Panel [NHANES]    Frequency of Communication with Friends and Family: More than three times a week    Frequency of Social Gatherings with Friends and Family: Once a week    Attends Religious Services: Patient declined    Database Administrator or Organizations: Patient declined    Attends Banker Meetings: Not on file    Marital Status: Never married  Intimate Partner Violence: Not on file    Review of Systems Per HPI.   Objective:   Vitals:   10/14/23 1406  BP: 128/82  Pulse: 93  Temp: 98.7 F (37.1 C)  TempSrc: Temporal  SpO2: 99%  Weight: 189 lb 12.8 oz (86.1 kg)  Height: 5' 7 (1.702 m)     Physical Exam Vitals reviewed.  Constitutional:      General: She is not in acute distress.    Appearance: She is well-developed.  HENT:     Head: Normocephalic and atraumatic.     Right Ear: Hearing, tympanic membrane, ear canal and external ear normal.     Left Ear: Hearing, tympanic membrane, ear canal and external ear normal.     Nose: Nose normal.     Mouth/Throat:     Pharynx: No oropharyngeal exudate or posterior oropharyngeal erythema.  Eyes:     Conjunctiva/sclera: Conjunctivae normal.     Pupils: Pupils are equal, round, and reactive to light.  Cardiovascular:     Rate and Rhythm: Normal rate and regular rhythm.     Heart sounds: Normal heart sounds. No murmur heard. Pulmonary:     Effort: Pulmonary effort is normal. No respiratory distress.     Breath sounds: No stridor. Wheezing (Noted with forced expiration.  None at rest.  No stridor.  Few coarse breath sounds on left lower lobe but cleared with cough.) present. No rhonchi.   Skin:    General: Skin is warm and dry.     Findings: No rash.  Neurological:     Mental Status: She is alert and oriented to person, place, and time.  Psychiatric:        Mood and Affect: Mood normal.        Behavior: Behavior normal.        Assessment & Plan:  Kristen Floyd is a 43 y.o. female . Subacute cough - Plan:  guaiFENesin -codeine  100-10 MG/5ML syrup  LRTI (lower respiratory tract infection) - Plan: azithromycin  (ZITHROMAX ) 250 MG tablet  Wheezing - Plan: predniSONE  (DELTASONE ) 20 MG tablet  Possible initial viral illness versus atypical with mycoplasma or pertussis given persistent cough, and although she did not have true posttussive emesis, was close to that initially.  Also suspect component of reactive airway with increased need for albuterol  and forced expiratory wheeze noted on exam.  Appears nontoxic, hydrated, slight improvement with the discoloration of phlegm and airways clear on exam after cough.  -Continue symptomatic care with fluids, rest.  Add codeine  cough syrup if needed every 6 hours.  -Continue albuterol  if needed for breakthrough wheeze but start prednisone  40 mg for 3 days.  Potential side effects discussed and she has taken prednisone  previously.  -Start azithromycin  to cover for atypicals as above, potential side effects and risks of antibiotics discussed  -RTC precautions.  Meds ordered this encounter  Medications   predniSONE  (DELTASONE ) 20 MG tablet    Sig: Take 2 tablets (40 mg total) by mouth daily with breakfast.    Dispense:  6 tablet    Refill:  0   guaiFENesin -codeine  100-10 MG/5ML syrup    Sig: Take 5-10 mLs by mouth every 6 (six) hours as needed for cough.    Dispense:  120 mL    Refill:  0   azithromycin  (ZITHROMAX ) 250 MG tablet    Sig: Take 2 tablets on day 1, then 1 tablet daily on days 2 through 5    Dispense:  6 tablet    Refill:  0   Patient Instructions  Sorry you still have that cough.  As we discussed it could have  been a viral illness but I think covering for possible atypicals like mycoplasma and pertussis would be reasonable at this time.  Azithromycin  was sent to your pharmacy.  I also suspect you have a component of reactive airway, especially with increased use of albuterol .  Prednisone  40 mg for 3 days should calm it down.  Okay to still use albuterol  if needed for breakthrough wheeze.  Additionally I sent in some codeine  cough syrup if needed up to every 6 hours.  See other information below.  Keep us  posted but I suspect you will be feeling much better by early next week.  Follow-up if any new or worsening symptoms.  Take care!  Cough, Adult Coughing is a reflex that clears your throat and airways (respiratory system). It helps heal and protect your lungs. It is normal to cough from time to time. A cough that happens with other symptoms or that lasts a long time may be a sign of a condition that needs treatment. A short-term (acute) cough may only last 2-3 weeks. A long-term (chronic) cough may last 8 or more weeks. Coughing is often caused by: Diseases, such as: An infection of the respiratory system. Asthma or other heart or lung diseases. Gastroesophageal reflux. This is when acid comes back up from the stomach. Breathing in things that irritate your lungs. Allergies. Postnasal drip. This is when mucus runs down the back of your throat. Smoking. Some medicines. Follow these instructions at home: Medicines Take over-the-counter and prescription medicines only as told by your health care provider. Talk with your provider before you take cough medicine (cough suppressants). Eating and drinking Do not drink alcohol. Avoid caffeine. Drink enough fluid to keep your pee (urine) pale yellow. Lifestyle Avoid cigarette smoke. Do not use any products that contain nicotine or tobacco.  These products include cigarettes, chewing tobacco, and vaping devices, such as e-cigarettes. If you need help quitting,  ask your provider. Avoid things that make you cough. These may include perfumes, candles, cleaning products, or campfire smoke. General instructions  Watch for any changes to your cough. Tell your provider about them. Always cover your mouth when you cough. If the air is dry in your bedroom or home, use a cool mist vaporizer or humidifier. If your cough is worse at night, try to sleep in a semi-upright position. Rest as needed. Contact a health care provider if: You have new symptoms, or your symptoms get worse. You cough up pus. You have a fever that does not go away or a cough that does not get better after 2-3 weeks. You cannot control your cough with medicine, and you are losing sleep. You have pain that gets worse or is not helped with medicine. You lose weight for no clear reason. You have night sweats. Get help right away if: You cough up blood. You have trouble breathing. Your heart is beating very fast. These symptoms may be an emergency. Get help right away. Call 911. Do not wait to see if the symptoms will go away. Do not drive yourself to the hospital. This information is not intended to replace advice given to you by your health care provider. Make sure you discuss any questions you have with your health care provider. Document Revised: 05/29/2022 Document Reviewed: 05/29/2022 Elsevier Patient Education  2024 [employer]     Signed,   Reyes Pines, MD [facility], [facility] 10/14/23 3:09 PM

## 2023-11-12 ENCOUNTER — Encounter: Payer: Self-pay | Admitting: Family Medicine

## 2023-11-12 DIAGNOSIS — L0291 Cutaneous abscess, unspecified: Secondary | ICD-10-CM | POA: Insufficient documentation

## 2023-11-15 ENCOUNTER — Encounter: Payer: Self-pay | Admitting: Family Medicine

## 2023-11-15 ENCOUNTER — Ambulatory Visit (INDEPENDENT_AMBULATORY_CARE_PROVIDER_SITE_OTHER): Payer: No Typology Code available for payment source | Admitting: Family Medicine

## 2023-11-15 VITALS — BP 118/70 | HR 83 | Temp 98.2°F | Ht 67.0 in | Wt 193.4 lb

## 2023-11-15 DIAGNOSIS — E669 Obesity, unspecified: Secondary | ICD-10-CM | POA: Diagnosis not present

## 2023-11-15 DIAGNOSIS — E559 Vitamin D deficiency, unspecified: Secondary | ICD-10-CM

## 2023-11-15 DIAGNOSIS — E010 Iodine-deficiency related diffuse (endemic) goiter: Secondary | ICD-10-CM | POA: Diagnosis not present

## 2023-11-15 DIAGNOSIS — Z0000 Encounter for general adult medical examination without abnormal findings: Secondary | ICD-10-CM | POA: Diagnosis not present

## 2023-11-15 NOTE — Progress Notes (Unsigned)
   Subjective:    Patient ID: Kristen Floyd, female    DOB: Jun 12, 1981, 43 y.o.   MRN: [id number]  HPI CPE- UTD on pap, mammo, Tdap  Patient Care Team    Relationship Specialty Notifications Start End  Sheliah Hatch, MD PCP - General Family Medicine  07/18/12   Cordelia Poche, MD Referring Physician Obstetrics and Gynecology  10/29/15     Health Maintenance  Topic Date Due   Pneumococcal Vaccine 16-42 Years old (2 of 2 - PPSV23 or PCV20) 12/08/2007   INFLUENZA VACCINE  05/13/2023   COVID-19 Vaccine (4 - 2024-25 season) 06/13/2023   Cervical Cancer Screening (HPV/Pap Cotest)  12/12/2025   DTaP/Tdap/Td (3 - Td or Tdap) 11/04/2026   Hepatitis C Screening  Completed   HIV Screening  Completed   HPV VACCINES  Aged Out      Review of Systems Patient reports no vision/ hearing changes, adenopathy,fever,  persistant/recurrent hoarseness , swallowing issues, chest pain, palpitations, edema, persistant/recurrent cough, hemoptysis, dyspnea (rest/exertional/paroxysmal nocturnal), gastrointestinal bleeding (melena, rectal bleeding), abdominal pain, significant heartburn, bowel changes, GU symptoms (dysuria, hematuria, incontinence), Gyn symptoms (abnormal  bleeding, pain),  syncope, focal weakness, memory loss, numbness & tingling, skin/hair/nail changes, abnormal bruising or bleeding, anxiety, or depression.   + 8 lb weight loss- was down to 181 prior to the holidays.    Objective:   Physical Exam General Appearance:    Alert, cooperative, no distress, appears stated age  Head:    Normocephalic, without obvious abnormality, atraumatic  Eyes:    PERRL, conjunctiva/corneas clear, EOM's intact both eyes  Ears:    Normal TM's and external ear canals, both ears  Nose:   Nares normal, septum midline, mucosa normal, no drainage    or sinus tenderness  Throat:   Lips, mucosa, and tongue normal; teeth and gums normal  Neck:   Supple, symmetrical, trachea midline, no adenopathy;    Thyroid:  thyromegaly  Back:     Symmetric, no curvature, ROM normal, no CVA tenderness  Lungs:     Clear to auscultation bilaterally, respirations unlabored  Chest Wall:    No tenderness or deformity   Heart:    Regular rate and rhythm, S1 and S2 normal, no murmur, rub   or gallop  Breast Exam:    Deferred to GYN  Abdomen:     Soft, non-tender, bowel sounds active all four quadrants,    no masses, no organomegaly  Genitalia:    Deferred to GYN  Rectal:    Extremities:   Extremities normal, atraumatic, no cyanosis or edema  Pulses:   2+ and symmetric all extremities  Skin:   Skin color, texture, turgor normal, no rashes or lesions  Lymph nodes:   Cervical, supraclavicular, and axillary nodes normal  Neurologic:   CNII-XII intact, normal strength, sensation and reflexes    throughout          Assessment & Plan:

## 2023-11-15 NOTE — Patient Instructions (Addendum)
Follow up in 1 year or as needed We'll notify you of your lab results and make any changes if needed Keep up the good work on healthy diet and regular exercise- you're doing great! Call with any questions or concerns Stay Safe!  Stay Healthy! Happy Valentine's Day!!!

## 2023-11-16 ENCOUNTER — Other Ambulatory Visit (HOSPITAL_BASED_OUTPATIENT_CLINIC_OR_DEPARTMENT_OTHER): Payer: Self-pay | Admitting: Family Medicine

## 2023-11-16 DIAGNOSIS — Z1231 Encounter for screening mammogram for malignant neoplasm of breast: Secondary | ICD-10-CM

## 2023-11-16 LAB — HEPATIC FUNCTION PANEL
ALT: 10 U/L (ref 0–35)
AST: 15 U/L (ref 0–37)
Albumin: 4.6 g/dL (ref 3.5–5.2)
Alkaline Phosphatase: 70 U/L (ref 39–117)
Bilirubin, Direct: 0.1 mg/dL (ref 0.0–0.3)
Total Bilirubin: 0.7 mg/dL (ref 0.2–1.2)
Total Protein: 7.6 g/dL (ref 6.0–8.3)

## 2023-11-16 LAB — CBC WITH DIFFERENTIAL/PLATELET
Basophils Absolute: 0.1 10*3/uL (ref 0.0–0.1)
Basophils Relative: 1.2 % (ref 0.0–3.0)
Eosinophils Absolute: 0.1 10*3/uL (ref 0.0–0.7)
Eosinophils Relative: 1.7 % (ref 0.0–5.0)
HCT: 40.8 % (ref 36.0–46.0)
Hemoglobin: 13.3 g/dL (ref 12.0–15.0)
Lymphocytes Relative: 46 % (ref 12.0–46.0)
Lymphs Abs: 2.9 10*3/uL (ref 0.7–4.0)
MCHC: 32.7 g/dL (ref 30.0–36.0)
MCV: 79.7 fL (ref 78.0–100.0)
Monocytes Absolute: 0.6 10*3/uL (ref 0.1–1.0)
Monocytes Relative: 9.2 % (ref 3.0–12.0)
Neutro Abs: 2.6 10*3/uL (ref 1.4–7.7)
Neutrophils Relative %: 41.9 % — ABNORMAL LOW (ref 43.0–77.0)
Platelets: 472 10*3/uL — ABNORMAL HIGH (ref 150.0–400.0)
RBC: 5.11 Mil/uL (ref 3.87–5.11)
RDW: 13.4 % (ref 11.5–15.5)
WBC: 6.3 10*3/uL (ref 4.0–10.5)

## 2023-11-16 LAB — LIPID PANEL
Cholesterol: 169 mg/dL (ref 0–200)
HDL: 58.3 mg/dL (ref >39.00)
LDL Cholesterol: 90 mg/dL (ref 0–99)
NonHDL: 110.37
Total CHOL/HDL Ratio: 3
Triglycerides: 101 mg/dL (ref 0.0–149.0)
VLDL: 20.2 mg/dL (ref 0.0–40.0)

## 2023-11-16 LAB — BASIC METABOLIC PANEL
BUN: 7 mg/dL (ref 6–23)
CO2: 27 meq/L (ref 19–32)
Calcium: 9.3 mg/dL (ref 8.4–10.5)
Chloride: 104 meq/L (ref 96–112)
Creatinine, Ser: 0.79 mg/dL (ref 0.40–1.20)
GFR: 92.82 mL/min (ref >60.00)
Glucose, Bld: 71 mg/dL (ref 70–99)
Potassium: 3.7 meq/L (ref 3.5–5.1)
Sodium: 140 meq/L (ref 135–145)

## 2023-11-16 LAB — TSH: TSH: 2.58 u[IU]/mL (ref 0.35–5.50)

## 2023-11-16 LAB — VITAMIN D 25 HYDROXY (VIT D DEFICIENCY, FRACTURES): VITD: 21.23 ng/mL — ABNORMAL LOW (ref 30.00–100.00)

## 2023-11-17 ENCOUNTER — Telehealth: Payer: Self-pay

## 2023-11-17 ENCOUNTER — Encounter: Payer: Self-pay | Admitting: Family Medicine

## 2023-11-17 ENCOUNTER — Other Ambulatory Visit: Payer: Self-pay

## 2023-11-17 MED ORDER — VITAMIN D (ERGOCALCIFEROL) 1.25 MG (50000 UNIT) PO CAPS
50000.0000 [IU] | ORAL_CAPSULE | ORAL | 0 refills | Status: DC
Start: 2023-11-17 — End: 2024-04-12

## 2023-11-17 NOTE — Assessment & Plan Note (Signed)
 Pt's PE WNL w/ exception of BMI and known thyromegaly.  UTD on pap, mammo, and Tdap.  Check labs.  Anticipatory guidance provided.

## 2023-11-17 NOTE — Assessment & Plan Note (Signed)
 Pt has not had recent US  to assess for any changes.  Ordered today.

## 2023-11-17 NOTE — Telephone Encounter (Signed)
-----   Message from Laymon Priest sent at 11/17/2023 12:38 PM EST ----- Labs look great w/ exception of low Vit D.  Based on this, we need to start 50,000 units weekly x12 weeks in addition to daily OTC supplement of at least 2000 units.

## 2023-11-17 NOTE — Assessment & Plan Note (Signed)
 She is down 8 lbs since last visit.  She was down even more prior to the holidays (181 lbs per report) and she plans to get back on track w/ diet and exercise.  Check labs to risk stratify.  Will follow.

## 2023-11-17 NOTE — Telephone Encounter (Signed)
 Vitamin D  50,000 has been sent in to pharmacy  Pt has reviewed labs via MyChart

## 2024-01-17 ENCOUNTER — Ambulatory Visit (HOSPITAL_BASED_OUTPATIENT_CLINIC_OR_DEPARTMENT_OTHER)
Admission: RE | Admit: 2024-01-17 | Discharge: 2024-01-17 | Disposition: A | Discharge status: Home / Self Care | Payer: No Typology Code available for payment source | Source: Ambulatory Visit | Attending: Family Medicine | Admitting: Family Medicine

## 2024-01-17 ENCOUNTER — Encounter (HOSPITAL_BASED_OUTPATIENT_CLINIC_OR_DEPARTMENT_OTHER): Payer: Self-pay

## 2024-01-17 DIAGNOSIS — E010 Iodine-deficiency related diffuse (endemic) goiter: Secondary | ICD-10-CM | POA: Insufficient documentation

## 2024-01-17 DIAGNOSIS — Z1231 Encounter for screening mammogram for malignant neoplasm of breast: Secondary | ICD-10-CM | POA: Diagnosis present

## 2024-01-20 ENCOUNTER — Telehealth: Payer: Self-pay

## 2024-01-20 ENCOUNTER — Encounter: Payer: Self-pay | Admitting: Family Medicine

## 2024-01-20 NOTE — Telephone Encounter (Signed)
 Pt has reviewed results via MyChart.

## 2024-01-20 NOTE — Telephone Encounter (Signed)
-----   Message from Neena Rhymes sent at 01/20/2024  7:22 AM EDT ----- No nodules seen on thyroid ultrasound- this is great news!

## 2024-03-10 ENCOUNTER — Encounter: Payer: Self-pay | Admitting: Family Medicine

## 2024-03-10 NOTE — Telephone Encounter (Signed)
 FYI pt notes lump on the arm, will monitor but wanted you aware in case she comes in for an exam

## 2024-04-12 ENCOUNTER — Encounter: Payer: Self-pay | Admitting: Family Medicine

## 2024-04-12 ENCOUNTER — Ambulatory Visit (INDEPENDENT_AMBULATORY_CARE_PROVIDER_SITE_OTHER): Admitting: Family Medicine

## 2024-04-12 VITALS — BP 124/100 | HR 87 | Temp 98.3°F | Ht 66.5 in | Wt 189.1 lb

## 2024-04-12 DIAGNOSIS — M7989 Other specified soft tissue disorders: Secondary | ICD-10-CM | POA: Diagnosis not present

## 2024-04-12 NOTE — Progress Notes (Signed)
   Subjective:    Patient ID: Kristen Floyd, female    DOB: [birth year], 43 y.o.   MRN: [id number]  HPI Lump- R arm.  First noticed 2 weeks ago when applying deodorant.  R posterior arm/tricep.  Not painful.  Pt feels that area may be smaller than when she first noticed it.  Is R handed.   Review of Systems For ROS see HPI     Objective:   Physical Exam Vitals reviewed.  Constitutional:      General: She is not in acute distress.    Appearance: Normal appearance. She is not ill-appearing.  HENT:     Head: Normocephalic and atraumatic.  Skin:    General: Skin is warm and dry.     Findings: No bruising or erythema.     Comments: 1 freely mobile subcutaneous soft tissue mass of R posterior arm, no overlying redness or TTP.  Neurological:     General: No focal deficit present.     Mental Status: She is alert and oriented to person, place, and time.  Psychiatric:        Mood and Affect: Mood normal.        Behavior: Behavior normal.        Thought Content: Thought content normal.           Assessment & Plan:  Soft tissue mass- new.  Likely a lipoma or other cystic mass given the sponginess and mobility of it.  Will get US  to further assess and r/o any possible malignant process.  Pt expressed understanding and is in agreement w/ plan.

## 2024-04-12 NOTE — Patient Instructions (Signed)
 Follow up as needed or as scheduled We'll call you to schedule your ultrasound Call with any questions or concerns Stay Safe!  Stay Healthy! HAPPY EARLY BIRTHDAY!!!

## 2024-04-23 ENCOUNTER — Ambulatory Visit (HOSPITAL_BASED_OUTPATIENT_CLINIC_OR_DEPARTMENT_OTHER)
Admission: RE | Admit: 2024-04-23 | Discharge: 2024-04-23 | Disposition: A | Discharge status: Home / Self Care | Source: Ambulatory Visit | Attending: Family Medicine | Admitting: Family Medicine

## 2024-04-23 DIAGNOSIS — M7989 Other specified soft tissue disorders: Secondary | ICD-10-CM | POA: Insufficient documentation

## 2024-04-25 ENCOUNTER — Ambulatory Visit: Payer: Self-pay | Admitting: Family Medicine

## 2024-04-25 NOTE — Progress Notes (Signed)
 Pt has been notified.

## 2024-11-15 ENCOUNTER — Encounter: Payer: Self-pay | Admitting: Family Medicine

## 2024-11-15 ENCOUNTER — Ambulatory Visit: Payer: No Typology Code available for payment source | Admitting: Family Medicine

## 2024-11-15 VITALS — BP 118/86 | HR 107 | Temp 99.9°F | Ht 66.5 in | Wt 190.4 lb

## 2024-11-15 DIAGNOSIS — E559 Vitamin D deficiency, unspecified: Secondary | ICD-10-CM

## 2024-11-15 DIAGNOSIS — Z0000 Encounter for general adult medical examination without abnormal findings: Secondary | ICD-10-CM

## 2024-11-15 DIAGNOSIS — E669 Obesity, unspecified: Secondary | ICD-10-CM

## 2024-11-15 LAB — HEPATIC FUNCTION PANEL
ALT: 13 U/L (ref 3–35)
AST: 11 U/L (ref 5–37)
Albumin: 4.8 g/dL (ref 3.5–5.2)
Alkaline Phosphatase: 59 U/L (ref 39–117)
Bilirubin, Direct: 0.2 mg/dL (ref 0.1–0.3)
Total Bilirubin: 1 mg/dL (ref 0.2–1.2)
Total Protein: 7.5 g/dL (ref 6.0–8.3)

## 2024-11-15 LAB — CBC WITH DIFFERENTIAL/PLATELET
Basophils Absolute: 0 10*3/uL (ref 0.0–0.1)
Basophils Relative: 0.2 % (ref 0.0–3.0)
Eosinophils Absolute: 0.1 10*3/uL (ref 0.0–0.7)
Eosinophils Relative: 1.3 % (ref 0.0–5.0)
HCT: 39.6 % (ref 36.0–46.0)
Hemoglobin: 13.4 g/dL (ref 12.0–15.0)
Lymphocytes Relative: 42.9 % (ref 12.0–46.0)
Lymphs Abs: 3 10*3/uL (ref 0.7–4.0)
MCHC: 33.8 g/dL (ref 30.0–36.0)
MCV: 77.4 fl — ABNORMAL LOW (ref 78.0–100.0)
Monocytes Absolute: 0.7 10*3/uL (ref 0.1–1.0)
Monocytes Relative: 10 % (ref 3.0–12.0)
Neutro Abs: 3.2 10*3/uL (ref 1.4–7.7)
Neutrophils Relative %: 45.6 % (ref 43.0–77.0)
Platelets: 486 10*3/uL — ABNORMAL HIGH (ref 150.0–400.0)
RBC: 5.11 Mil/uL (ref 3.87–5.11)
RDW: 13.2 % (ref 11.5–15.5)
WBC: 7 10*3/uL (ref 4.0–10.5)

## 2024-11-15 LAB — BASIC METABOLIC PANEL WITH GFR
BUN: 11 mg/dL (ref 6–23)
CO2: 26 meq/L (ref 19–32)
Calcium: 9.9 mg/dL (ref 8.4–10.5)
Chloride: 104 meq/L (ref 96–112)
Creatinine, Ser: 0.8 mg/dL (ref 0.40–1.20)
GFR: 90.79 mL/min
Glucose, Bld: 71 mg/dL (ref 70–99)
Potassium: 4.6 meq/L (ref 3.5–5.1)
Sodium: 140 meq/L (ref 135–145)

## 2024-11-15 LAB — LIPID PANEL
Cholesterol: 163 mg/dL (ref 28–200)
HDL: 54.5 mg/dL
LDL Cholesterol: 88 mg/dL (ref 10–99)
NonHDL: 108.69
Total CHOL/HDL Ratio: 3
Triglycerides: 102 mg/dL (ref 10.0–149.0)
VLDL: 20.4 mg/dL (ref 0.0–40.0)

## 2024-11-15 LAB — TSH: TSH: 1.61 u[IU]/mL (ref 0.35–5.50)

## 2024-11-15 LAB — VITAMIN D 25 HYDROXY (VIT D DEFICIENCY, FRACTURES): VITD: 20.18 ng/mL — ABNORMAL LOW (ref 30.00–100.00)

## 2024-11-15 NOTE — Patient Instructions (Signed)
 Follow up in 1 year or as needed We'll notify you of your lab results and make any changes if needed Continue to work on healthy diet and regular exercise- you're doing great! Call with any questions or concerns Stay Safe!  Stay Healthy! Happy Valentine's Day!!

## 2024-11-15 NOTE — Progress Notes (Unsigned)
" ° °  Subjective:    Patient ID: Kristen Floyd, female    DOB: November 07, 1980, 44 y.o.   MRN: [id number]  HPI CPE- UTD on mammo, pap, Tdap  Health Maintenance  Topic Date Due   Hepatitis B Vaccines 19-59 Average Risk (1 of 3 - 19+ 3-dose series) Never done   COVID-19 Vaccine (5 - 2025-26 season) 12/01/2024 (Originally 06/12/2024)   Pneumococcal Vaccine (2 of 2 - PPSV23, PCV20, or PCV21) 12/08/2024 (Originally 12/08/2007)   Influenza Vaccine  01/09/2025 (Originally 05/12/2024)   Mammogram  01/16/2026   DTaP/Tdap/Td (3 - Td or Tdap) 11/04/2026   Cervical Cancer Screening (HPV/Pap Cotest)  11/24/2028   HPV VACCINES (No Doses Required) Completed   Hepatitis C Screening  Completed   HIV Screening  Completed   Meningococcal B Vaccine  Aged Out     Patient Care Team    Relationship Specialty Notifications Start End  Mahlon Comer BRAVO, MD PCP - General Family Medicine  07/18/12   Vernice Claude, MD Referring Physician Obstetrics and Gynecology  10/29/15       Review of Systems Patient reports no vision/ hearing changes, adenopathy,fever, weight change,  persistant/recurrent hoarseness , swallowing issues, chest pain, palpitations, edema, persistant/recurrent cough, hemoptysis, dyspnea (rest/exertional/paroxysmal nocturnal), gastrointestinal bleeding (melena, rectal bleeding), abdominal pain, significant heartburn, bowel changes, GU symptoms (dysuria, hematuria, incontinence), Gyn symptoms (abnormal  bleeding, pain),  syncope, focal weakness, memory loss, numbness & tingling, skin/hair/nail changes, abnormal bruising or bleeding, anxiety, or depression.     Objective:   Physical Exam General Appearance:    Alert, cooperative, no distress, appears stated age  Head:    Normocephalic, without obvious abnormality, atraumatic  Eyes:    PERRL, conjunctiva/corneas clear, EOM's intact both eyes  Ears:    Normal TM's and external ear canals, both ears  Nose:   Nares normal, septum midline, mucosa normal, no  drainage    or sinus tenderness  Throat:   Lips, mucosa, and tongue normal; teeth and gums normal  Neck:   Supple, symmetrical, trachea midline, no adenopathy;    Thyroid : no enlargement/tenderness/nodules  Back:     Symmetric, no curvature, ROM normal, no CVA tenderness  Lungs:     Clear to auscultation bilaterally, respirations unlabored  Chest Wall:    No tenderness or deformity   Heart:    Regular rate and rhythm, S1 and S2 normal, no murmur, rub   or gallop  Breast Exam:    Deferred to GYN  Abdomen:     Soft, non-tender, bowel sounds active all four quadrants,    no masses, no organomegaly  Genitalia:    Deferred to GYN  Rectal:    Extremities:   Extremities normal, atraumatic, no cyanosis or edema  Pulses:   2+ and symmetric all extremities  Skin:   Skin color, texture, turgor normal, no rashes or lesions  Lymph nodes:   Cervical, supraclavicular, and axillary nodes normal  Neurologic:   CNII-XII intact, normal strength, sensation and reflexes    throughout          Assessment & Plan:    "

## 2024-11-16 ENCOUNTER — Ambulatory Visit: Payer: Self-pay | Admitting: Family Medicine

## 2024-11-16 MED ORDER — VITAMIN D (ERGOCALCIFEROL) 1.25 MG (50000 UNIT) PO CAPS: 50000.0000 [IU] | ORAL_CAPSULE | ORAL | 0 refills | Status: AC

## 2025-11-16 ENCOUNTER — Encounter: Admitting: Family Medicine
